# Patient Record
Sex: Male | Born: 2000
Health system: Southern US, Community
[De-identification: ages and names within clinical notes are randomized; demographics above are authoritative.]

## PROBLEM LIST (undated history)

## (undated) DIAGNOSIS — F419 Anxiety disorder, unspecified: Secondary | ICD-10-CM

## (undated) DIAGNOSIS — K219 Gastro-esophageal reflux disease without esophagitis: Secondary | ICD-10-CM

## (undated) DIAGNOSIS — F32A Depression, unspecified: Secondary | ICD-10-CM

## (undated) DIAGNOSIS — T7840XA Allergy, unspecified, initial encounter: Secondary | ICD-10-CM

## (undated) DIAGNOSIS — F909 Attention-deficit hyperactivity disorder, unspecified type: Secondary | ICD-10-CM

## (undated) HISTORY — DX: Allergy, unspecified, initial encounter: T78.40XA

## (undated) HISTORY — DX: Gastro-esophageal reflux disease without esophagitis: K21.9

## (undated) HISTORY — DX: Attention-deficit hyperactivity disorder, unspecified type: F90.9

## (undated) HISTORY — DX: Depression, unspecified: F32.A

## (undated) HISTORY — DX: Anxiety disorder, unspecified: F41.9

---
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
## 2019-02-02 DIAGNOSIS — R21 Rash and other nonspecific skin eruption: Secondary | ICD-10-CM | POA: Diagnosis not present

## 2019-02-02 DIAGNOSIS — W57XXXA Bitten or stung by nonvenomous insect and other nonvenomous arthropods, initial encounter: Secondary | ICD-10-CM | POA: Diagnosis not present

## 2019-06-01 DIAGNOSIS — F4010 Social phobia, unspecified: Secondary | ICD-10-CM | POA: Diagnosis not present

## 2019-06-01 DIAGNOSIS — F902 Attention-deficit hyperactivity disorder, combined type: Secondary | ICD-10-CM | POA: Diagnosis not present

## 2019-06-01 DIAGNOSIS — F419 Anxiety disorder, unspecified: Secondary | ICD-10-CM | POA: Diagnosis not present

## 2019-06-01 DIAGNOSIS — F411 Generalized anxiety disorder: Secondary | ICD-10-CM | POA: Diagnosis not present

## 2019-08-24 DIAGNOSIS — F411 Generalized anxiety disorder: Secondary | ICD-10-CM | POA: Diagnosis not present

## 2019-08-24 DIAGNOSIS — F902 Attention-deficit hyperactivity disorder, combined type: Secondary | ICD-10-CM | POA: Diagnosis not present

## 2019-08-24 DIAGNOSIS — F4010 Social phobia, unspecified: Secondary | ICD-10-CM | POA: Diagnosis not present

## 2019-08-24 DIAGNOSIS — F419 Anxiety disorder, unspecified: Secondary | ICD-10-CM | POA: Diagnosis not present

## 2019-09-29 ENCOUNTER — Other Ambulatory Visit: Payer: Self-pay

## 2019-09-29 ENCOUNTER — Encounter: Payer: Self-pay | Admitting: Family Medicine

## 2019-09-29 ENCOUNTER — Ambulatory Visit: Payer: Self-pay | Admitting: Family Medicine

## 2019-09-29 ENCOUNTER — Other Ambulatory Visit: Payer: Self-pay | Admitting: Family Medicine

## 2019-09-29 VITALS — BP 129/71 | HR 84 | Temp 97.8°F | Ht 71.75 in | Wt 263.6 lb

## 2019-09-29 DIAGNOSIS — F3341 Major depressive disorder, recurrent, in partial remission: Secondary | ICD-10-CM

## 2019-09-29 DIAGNOSIS — F411 Generalized anxiety disorder: Secondary | ICD-10-CM

## 2019-09-29 DIAGNOSIS — Z7689 Persons encountering health services in other specified circumstances: Secondary | ICD-10-CM | POA: Diagnosis not present

## 2019-09-29 DIAGNOSIS — F902 Attention-deficit hyperactivity disorder, combined type: Secondary | ICD-10-CM | POA: Diagnosis not present

## 2019-09-29 DIAGNOSIS — Z0000 Encounter for general adult medical examination without abnormal findings: Secondary | ICD-10-CM

## 2019-09-29 DIAGNOSIS — R7309 Other abnormal glucose: Secondary | ICD-10-CM

## 2019-09-29 DIAGNOSIS — E669 Obesity, unspecified: Secondary | ICD-10-CM

## 2019-09-29 DIAGNOSIS — E785 Hyperlipidemia, unspecified: Secondary | ICD-10-CM

## 2019-09-29 DIAGNOSIS — K219 Gastro-esophageal reflux disease without esophagitis: Secondary | ICD-10-CM | POA: Insufficient documentation

## 2019-09-29 DIAGNOSIS — Z87898 Personal history of other specified conditions: Secondary | ICD-10-CM

## 2019-09-29 DIAGNOSIS — Z79899 Other long term (current) drug therapy: Secondary | ICD-10-CM

## 2019-09-29 MED ORDER — OMEPRAZOLE 20 MG PO CPDR: 20.0000 mg | DELAYED_RELEASE_CAPSULE | Freq: Every day | ORAL | 2 refills | Status: DC

## 2019-09-29 NOTE — Patient Instructions (Addendum)
Thank you for coming to the office today.  Request records from specialists and pediatrician.  Keep on current meds. Keep asking Dr Daleen Squibb about future plans on medicines and adjustment.  Start generic Omeprazole 20mg  daily before breakfast 10-82min. For 4-6 weeks then taper off gradually if needed.  DIET RECOMMENDATIONS - Avoid spicy, greasy, fried foods, also things like caffeine, dark chocolate, peppermint can worsen - Avoid large meals and late night snacks, also do not go more than 4-5 hours without a snack or meal (not eating will worsen reflux symptoms due to stomach acid) - You may also elevate the head of your bed at night to sleep at very slight incline to help reduce symptoms  If the problem improves but keeps coming back, we can discuss higher dose or longer course at next visit.  If symptoms are worsening or persistent despite treatment or develop any different severe esophagus or abdominal pain, unable to swallow solids or liquids, nausea, vomiting especially blood in vomit, fever/chills, or unintentional weight loss / no appetite, please follow-up sooner in office or seek more immediate medical attention at hospital Emergency Department.  Regarding other medicines:  - STOP taking Ibuprofen, Advil, Motrin, Goody's / BC powder - DO NOT take without discussing with your doctor. These medicines can put you at high risk for future bleeding.  If need pain medicine, may take Tylenol Extra Strength (Acetaminophen) 500mg  tabs - take 1 to 2 tabs per dose (max 1000mg ) every 6-8 hours for pain (take regularly, don't skip a dose for next 7 days), max 24 hour daily dose is 6 tablets or 3000mg . In the future you can repeat the same everyday Tylenol course for 1-2 weeks at a time.    DUE for FASTING BLOOD WORK (no food or drink after midnight before the lab appointment, only water or coffee without cream/sugar on the morning of)  SCHEDULE "Lab Only" visit in the morning at the clinic for  lab draw in 4 WEEKS  - Make sure Lab Only appointment is at about 1 week before your next appointment, so that results will be available  For Lab Results, once available within 2-3 days of blood draw, you can can log in to MyChart online to view your results and a brief explanation. Also, we can discuss results at next follow-up visit.   Please schedule a Follow-up Appointment to: Return in about 4 weeks (around 10/27/2019) for Annual Physical.  If you have any other questions or concerns, please feel free to call the office or send a message through MyChart. You may also schedule an earlier appointment if necessary.  Additionally, you may be receiving a survey about your experience at our office within a few days to 1 week by e-mail or mail. We value your feedback.  , DO [facility], 

## 2019-09-29 NOTE — Progress Notes (Signed)
Subjective:    Patient ID: Kevin Moody, male    DOB: 03/08/2001, 19 y.o.   MRN: [id number]  Kevin Moody is a 19 y.o. male presenting on 09/29/2019 for Establish Care, ADHD, Depression, Anxiety, and Gastroesophageal Reflux  Here for establish care. He is accompanied by his mother, Kevin Moody.  HPI   History epistaxis Frequent problem, easily provoked. No significant prior issue. Dr Elenore RotaJuengel ENT has seen in past.  Major Depression / Anxiety / ADHD Reports initial diagnosis age 89 with ADHD pediatrician, was referred to [facility] at age 515-6, has been managed since. Family reports that there was no obvious anxiety or depression prior to managing ADHD. It was difficult to determine if meds for ADHD factored it. He has had some mood swings as entered adolescence. - Significant fam history of mental health problems both parents, mother has depression, anxiety, bipolar - He is currently followed by [facility]iatry at [facility] - Dr Daleen SquibbWall in [place] for past 5-6 years. - Concerta (generic Methylphenidate) 36mg  CR daily, taking 5-6 times a week, does not always take on weekend, may skip dose if not going to work. - Fluoxetine 10mg  x 3 = 30mg  daily - Buspar 30mg  daily in AM, can take PM if needed. For anxiety and agitation. - unsure if helping - Lorazepam 1mg  take q 8 hr PRN anxiety panic or severe anxiety agitation - takes 1-2 times a month if that. - Taking Trazodone 50mg  nightly PRN insomnia - maybe taking 2-3 times a month  GERD Reports symptoms bothering him for past 10+ years, mild symptoms usually worse with certain foods, he has done OTC remedy at times. He says acidic foods can worsen. Often has larger meal in evening, not eating as much during day. - He may have taken OTC PPI but unsure last dose. - Doesn't drink coffee. Drinks 3-4 sodas most days he does drink a lot of water. Nicotine from Vaping. - mother doesn't like long term PPI she had issues with  it.  History of Elevated Cholesterol Fam history of HTN / HLD.  Vaping / E-Cig user He was dipping tobacco smokeless for intermittent for 1 year Now E-Cig Vaping, Using for past 2 years now. Admits vaping multiple times a day, if he is working he can go most of day without it. Seems to mostly be a habit.  Graduated high school He is working at [employer] store in [place] He may go to community college future Living in [place] with mother No siblings. He has friends in countryside and hunting and fishing.  Health Maintenance:  UTD for vaccines. Will review through [facility]  Depression screen [facility] 2/9 09/29/2019  Decreased Interest 0  Down, Depressed, Hopeless 0  PHQ - 2 Score 0  Altered sleeping 0  Tired, decreased energy 1  Change in appetite 0  Feeling bad or failure about yourself  0  Trouble concentrating 1  Moving slowly or fidgety/restless 1  Suicidal thoughts 0  PHQ-9 Score 3  Difficult doing work/chores Not difficult at all   GAD 7 : Generalized Anxiety Score 09/29/2019  Nervous, Anxious, on Edge 1  Control/stop worrying 1  Worry too much - different things 1  Trouble relaxing 1  Restless 0  Easily annoyed or irritable 2  Afraid - awful might happen 0  Total GAD 7 Score 6  Anxiety Difficulty Somewhat difficult      Past Medical History:  Diagnosis Date  . ADHD   . Allergy   .  Anxiety   . Depression   . GERD (gastroesophageal reflux disease)    Past Surgical History:  Procedure Laterality Date  . TYMPANOSTOMY TUBE PLACEMENT Bilateral 2004   repeat 2007   Social History   Socioeconomic History  . Marital status: Single    Spouse name: Not on file  . Number of children: Not on file  . Years of education: [employer]  . Highest education level: High school graduate  Occupational History  . Occupation: Hardware  Tobacco Use  . Smoking status: Never Smoker  . Smokeless tobacco: Former Neurosurgeon  . Tobacco comment: previously 1/3 can dipping 2 years  Substance  and Sexual Activity  . Alcohol use: Never  . Drug use: Yes    Types: Marijuana  . Sexual activity: Not on file  Other Topics Concern  . Not on file  Social History Narrative  . Not on file   Social Determinants of Health   Financial Resource Strain:   . Difficulty of Paying Living Expenses:   Food Insecurity:   . Worried About Programme researcher, broadcasting/film/video in the Last Year:   . Barista in the Last Year:   Transportation Needs:   . Freight forwarder (Medical):   Marland Kitchen Lack of Transportation (Non-Medical):   Physical Activity:   . Days of Exercise per Week:   . Minutes of Exercise per Session:   Stress:   . Feeling of Stress :   Social Connections:   . Frequency of Communication with Friends and Family:   . Frequency of Social Gatherings with Friends and Family:   . Attends Religious Services:   . Active Member of Clubs or Organizations:   . Attends Banker Meetings:   Marland Kitchen Marital Status:   Intimate Partner Violence:   . Fear of Current or Ex-Partner:   . Emotionally Abused:   Marland Kitchen Physically Abused:   . Sexually Abused:    Family History  Problem Relation Age of Onset  . Depression Mother   . Anxiety disorder Mother   . Bipolar disorder Mother   . Depression Father   . Anxiety disorder Father   . Depression Maternal Grandmother   . Anxiety disorder Maternal Grandmother   . Alcohol abuse Maternal Grandfather   . Alcohol abuse Maternal Uncle    Current Outpatient Medications on File Prior to Visit  Medication Sig  . acetaminophen (TYLENOL) 500 MG tablet Take by mouth.  . busPIRone (BUSPAR) 30 MG tablet Take 30 mg by mouth 2 (two) times daily. Patient is taking once daily  . FLUoxetine (PROZAC) 10 MG capsule Patient takes 3x10 mg capsules total 30 mg once per day in am daily  . LORazepam (ATIVAN) 1 MG tablet Take 1 mg by mouth every 8 (eight) hours as needed.  . methylphenidate 36 MG PO CR tablet Take 36 mg by mouth every morning.  . ondansetron (ZOFRAN  ODT) 4 MG disintegrating tablet Take 1 tablet (4 mg total) by mouth every 8 (eight) hours as needed for nausea or vomiting.  . traZODone (DESYREL) 50 MG tablet Take 50 mg by mouth daily as needed.   No current facility-administered medications on file prior to visit.    Review of Systems Per HPI unless specifically indicated above      Objective:    BP 129/71   Pulse 84   Temp 97.8 F (36.6 C) (Temporal)   Ht 5' 11.75" (1.822 m)   Wt 263 lb 9.6 oz (119.6 kg)  SpO2 98%   BMI 36.00 kg/m   Wt Readings from Last 3 Encounters:  09/29/19 263 lb 9.6 oz (119.6 kg) (>99 %, Z= 2.61)*  04/16/16 198 lb (89.8 kg) (97 %, Z= 1.87)*   * Growth percentiles are based on CDC (Boys, 2-20 Years) data.    Physical Exam Vitals and nursing note reviewed.  Constitutional:      General: He is not in acute distress.    Appearance: He is well-developed. He is obese. He is not diaphoretic.     Comments: Well-appearing, comfortable, cooperative  HENT:     Head: Normocephalic and atraumatic.  Eyes:     General:        Right eye: No discharge.        Left eye: No discharge.     Conjunctiva/sclera: Conjunctivae normal.  Cardiovascular:     Rate and Rhythm: Normal rate.  Pulmonary:     Effort: Pulmonary effort is normal.  Skin:    General: Skin is warm and dry.     Findings: No erythema or rash.  Neurological:     Mental Status: He is alert and oriented to person, place, and time.  Psychiatric:        Behavior: Behavior normal.     Comments: Well groomed, good eye contact, normal speech and thoughts        Results for orders placed or performed during the hospital encounter of 04/16/16  CBC with Differential  Result Value Ref Range   WBC 27.9 (H) 3.8 - 10.6 K/uL   RBC 5.79 4.40 - 5.90 MIL/uL   Hemoglobin 16.5 13.0 - 18.0 g/dL   HCT 48.3 40.0 - 52.0 %   MCV 83.5 80.0 - 100.0 fL   MCH 28.4 26.0 - 34.0 pg   MCHC 34.1 32.0 - 36.0 g/dL   RDW 13.1 11.5 - 14.5 %   Platelets 310 150 - 440  K/uL   Neutrophils Relative % 90 %   Neutro Abs 25.2 (H) 1.4 - 6.5 K/uL   Lymphocytes Relative 2 %   Lymphs Abs 0.6 (L) 1.0 - 3.6 K/uL   Monocytes Relative 7 %   Monocytes Absolute 1.8 (H) 0.2 - 1.0 K/uL   Eosinophils Relative 1 %   Eosinophils Absolute 0.1 0 - 0.7 K/uL   Basophils Relative 0 %   Basophils Absolute 0.1 0 - 0.1 K/uL  Comprehensive metabolic panel  Result Value Ref Range   Sodium 138 135 - 145 mmol/L   Potassium 4.0 3.5 - 5.1 mmol/L   Chloride 103 101 - 111 mmol/L   CO2 23 22 - 32 mmol/L   Glucose, Bld 145 (H) 65 - 99 mg/dL   BUN 20 6 - 20 mg/dL   Creatinine, Ser 0.75 0.50 - 1.00 mg/dL   Calcium 9.9 8.9 - 10.3 mg/dL   Total Protein 8.2 (H) 6.5 - 8.1 g/dL   Albumin 4.8 3.5 - 5.0 g/dL   AST 38 15 - 41 U/L   ALT 41 17 - 63 U/L   Alkaline Phosphatase 138 52 - 171 U/L   Total Bilirubin 0.9 0.3 - 1.2 mg/dL   GFR calc non Af Amer NOT CALCULATED >60 mL/min   GFR calc Af Amer NOT CALCULATED >60 mL/min   Anion gap 12 5 - 15  Lipase, blood  Result Value Ref Range   Lipase 70 (H) 11 - 51 U/L  CBC  Result Value Ref Range   WBC 20.9 (H) 3.8 - 10.6 K/uL  RBC 4.96 4.40 - 5.90 MIL/uL   Hemoglobin 14.1 13.0 - 18.0 g/dL   HCT 16.3 84.5 - 36.4 %   MCV 83.5 80.0 - 100.0 fL   MCH 28.5 26.0 - 34.0 pg   MCHC 34.1 32.0 - 36.0 g/dL   RDW 68.0 32.1 - 22.4 %   Platelets 278 150 - 440 K/uL  Mononucleosis screen  Result Value Ref Range   Mono Screen POSITIVE (A) NEGATIVE  Urinalysis, Routine w reflex microscopic  Result Value Ref Range   Color, Urine YELLOW (A) YELLOW   APPearance CLEAR (A) CLEAR   Specific Gravity, Urine >1.046 (H) 1.005 - 1.030   pH 6.0 5.0 - 8.0   Glucose, UA NEGATIVE NEGATIVE mg/dL   Hgb urine dipstick SMALL (A) NEGATIVE   Bilirubin Urine NEGATIVE NEGATIVE   Ketones, ur NEGATIVE NEGATIVE mg/dL   Protein, ur NEGATIVE NEGATIVE mg/dL   Nitrite NEGATIVE NEGATIVE   Leukocytes, UA NEGATIVE NEGATIVE   RBC / HPF 6-30 0 - 5 RBC/hpf   WBC, UA NONE SEEN 0 - 5  WBC/hpf   Bacteria, UA NONE SEEN NONE SEEN   Squamous Epithelial / LPF 0-5 (A) NONE SEEN   Mucus PRESENT       Assessment & Plan:   Problem List Items Addressed This Visit    Major depressive disorder, recurrent, in partial remission (HCC)   Relevant Medications   busPIRone (BUSPAR) 30 MG tablet   traZODone (DESYREL) 50 MG tablet   FLUoxetine (PROZAC) 10 MG capsule   LORazepam (ATIVAN) 1 MG tablet   Gastroesophageal reflux disease - Primary   Relevant Medications   omeprazole (PRILOSEC) 20 MG capsule   GAD (generalized anxiety disorder)   Relevant Medications   busPIRone (BUSPAR) 30 MG tablet   traZODone (DESYREL) 50 MG tablet   FLUoxetine (PROZAC) 10 MG capsule   LORazepam (ATIVAN) 1 MG tablet   Attention deficit hyperactivity disorder (ADHD), combined type    Other Visit Diagnoses    Encounter to establish care with new doctor       History of epistaxis         #Depression / Anxiety / ADHD, see above diagnoses Chronic problems since childhood, onset age 46 for ADHD concerns expressed with uncertain triggers for his mental health problems and if related to medication and genetics as he has strong fam history of mental health problem. Some history was suggestive for possible bipolar with his mood swings and depression/anxiety, will request Psychiatry records to review. - Currently seems well controlled and doing well, graduated high school and currently working - He is interested to reduce medicines in future. He will continue to work with his Psychiatrist of past 5-6 years - Discussed that I would likely not manage his psychiatric medications at this time given multiple comorbid conditions and multiple higher risk medications. - Some meds are PRN very rarely used - reassurance  #GERD Chronic problem 10+ years, mild persistent issue Seems primary trigger is dietary foods, lifestyle large late meal, nicotine (Vaping) caffeine (soda) Trial on PPI rx Omeprazole 20mg  daily in AM  before breakfast 4-6 weeks then may taper off, goal to avoid long term PPI use Encourage lifestyle diet modifications, eventual goal to avoid nicotine/caffeine to help control, weight loss   Request outside records from previous Pediatrician and Psychiatry [facility] [address]   Meds ordered this encounter  Medications  . omeprazole (PRILOSEC) 20 MG capsule    Sig: Take 1 capsule (20 mg total) by mouth daily  before breakfast. For 4-6 weeks then taper down off gradually    Dispense:  30 capsule    Refill:  2     Follow up plan: Return in about 4 weeks (around 10/27/2019) for Annual Physical.   Future labs in 4 weeks 10/29/19  Saralyn Pilar, DO [facility] 09/29/2019, 3:32 PM

## 2019-10-29 ENCOUNTER — Other Ambulatory Visit: Payer: BC Managed Care – PPO

## 2019-10-29 ENCOUNTER — Other Ambulatory Visit: Payer: Self-pay

## 2019-10-29 DIAGNOSIS — R7309 Other abnormal glucose: Secondary | ICD-10-CM | POA: Diagnosis not present

## 2019-10-29 DIAGNOSIS — E785 Hyperlipidemia, unspecified: Secondary | ICD-10-CM | POA: Diagnosis not present

## 2019-10-29 DIAGNOSIS — E66811 Obesity, class 1: Secondary | ICD-10-CM

## 2019-10-29 DIAGNOSIS — F3341 Major depressive disorder, recurrent, in partial remission: Secondary | ICD-10-CM

## 2019-10-29 DIAGNOSIS — F411 Generalized anxiety disorder: Secondary | ICD-10-CM

## 2019-10-29 DIAGNOSIS — Z0000 Encounter for general adult medical examination without abnormal findings: Secondary | ICD-10-CM | POA: Diagnosis not present

## 2019-10-29 DIAGNOSIS — F902 Attention-deficit hyperactivity disorder, combined type: Secondary | ICD-10-CM

## 2019-10-29 DIAGNOSIS — Z79899 Other long term (current) drug therapy: Secondary | ICD-10-CM | POA: Diagnosis not present

## 2019-10-29 DIAGNOSIS — E669 Obesity, unspecified: Secondary | ICD-10-CM

## 2019-10-30 LAB — CBC WITH DIFFERENTIAL/PLATELET
Absolute Monocytes: 874 cells/uL (ref 200–950)
Basophils Absolute: 83 cells/uL (ref 0–200)
Basophils Relative: 0.8 %
Eosinophils Absolute: 218 cells/uL (ref 15–500)
Eosinophils Relative: 2.1 %
HCT: 46.8 % (ref 38.5–50.0)
Hemoglobin: 15.3 g/dL (ref 13.2–17.1)
Lymphs Abs: 3026 cells/uL (ref 850–3900)
MCH: 28.7 pg (ref 27.0–33.0)
MCHC: 32.7 g/dL (ref 32.0–36.0)
MCV: 87.6 fL (ref 80.0–100.0)
MPV: 11.6 fL (ref 7.5–12.5)
Monocytes Relative: 8.4 %
Neutro Abs: 6198 cells/uL (ref 1500–7800)
Neutrophils Relative %: 59.6 %
Platelets: 318 10*3/uL (ref 140–400)
RBC: 5.34 10*6/uL (ref 4.20–5.80)
RDW: 12.8 % (ref 11.0–15.0)
Total Lymphocyte: 29.1 %
WBC: 10.4 10*3/uL (ref 3.8–10.8)

## 2019-10-30 LAB — COMPLETE METABOLIC PANEL WITH GFR
AG Ratio: 1.6 (calc) (ref 1.0–2.5)
ALT: 26 U/L (ref 8–46)
AST: 20 U/L (ref 12–32)
Albumin: 4.6 g/dL (ref 3.6–5.1)
Alkaline phosphatase (APISO): 95 U/L (ref 46–169)
BUN: 18 mg/dL (ref 7–20)
CO2: 30 mmol/L (ref 20–32)
Calcium: 10.3 mg/dL (ref 8.9–10.4)
Chloride: 104 mmol/L (ref 98–110)
Creat: 0.8 mg/dL (ref 0.60–1.26)
GFR, Est African American: 150 mL/min/{1.73_m2} (ref >=60)
GFR, Est Non African American: 130 mL/min/{1.73_m2} (ref >=60)
Globulin: 2.8 g/dL (calc) (ref 2.1–3.5)
Glucose, Bld: 91 mg/dL (ref 65–99)
Potassium: 4.8 mmol/L (ref 3.8–5.1)
Sodium: 139 mmol/L (ref 135–146)
Total Bilirubin: 0.3 mg/dL (ref 0.2–1.1)
Total Protein: 7.4 g/dL (ref 6.3–8.2)

## 2019-10-30 LAB — TSH: TSH: 3.53 mIU/L (ref 0.50–4.30)

## 2019-10-30 LAB — LIPID PANEL
Cholesterol: 190 mg/dL — ABNORMAL HIGH (ref <170)
HDL: 38 mg/dL — ABNORMAL LOW (ref >45)
LDL Cholesterol (Calc): 111 mg/dL (calc) — ABNORMAL HIGH (ref <110)
Non-HDL Cholesterol (Calc): 152 mg/dL (calc) — ABNORMAL HIGH (ref <120)
Total CHOL/HDL Ratio: 5 (calc) — ABNORMAL HIGH (ref <5.0)
Triglycerides: 309 mg/dL — ABNORMAL HIGH (ref <90)

## 2019-10-30 LAB — HEMOGLOBIN A1C
Hgb A1c MFr Bld: 5.4 % of total Hgb (ref <5.7)
Mean Plasma Glucose: 108 (calc)
eAG (mmol/L): 6 (calc)

## 2019-11-02 ENCOUNTER — Encounter: Payer: Self-pay | Admitting: Family Medicine

## 2019-11-02 ENCOUNTER — Other Ambulatory Visit: Payer: Self-pay | Admitting: Family Medicine

## 2019-11-02 ENCOUNTER — Ambulatory Visit (INDEPENDENT_AMBULATORY_CARE_PROVIDER_SITE_OTHER): Payer: BC Managed Care – PPO | Admitting: Family Medicine

## 2019-11-02 ENCOUNTER — Other Ambulatory Visit: Payer: Self-pay

## 2019-11-02 VITALS — BP 123/56 | HR 74 | Temp 97.5°F | Resp 16 | Ht 71.0 in | Wt 260.0 lb

## 2019-11-02 DIAGNOSIS — E669 Obesity, unspecified: Secondary | ICD-10-CM | POA: Diagnosis not present

## 2019-11-02 DIAGNOSIS — E782 Mixed hyperlipidemia: Secondary | ICD-10-CM

## 2019-11-02 DIAGNOSIS — F3341 Major depressive disorder, recurrent, in partial remission: Secondary | ICD-10-CM | POA: Diagnosis not present

## 2019-11-02 DIAGNOSIS — Z0000 Encounter for general adult medical examination without abnormal findings: Secondary | ICD-10-CM

## 2019-11-02 DIAGNOSIS — K219 Gastro-esophageal reflux disease without esophagitis: Secondary | ICD-10-CM

## 2019-11-02 DIAGNOSIS — Z79899 Other long term (current) drug therapy: Secondary | ICD-10-CM

## 2019-11-02 NOTE — Assessment & Plan Note (Signed)
Improved GERD on lifestyle and PPI Keep improving diet, avoid trigger foods, eating patterns May taper down on PPI Omeprazole 20mg , use current med, phase down to every other day then every 3rd day, may come off, notify if need new rx for PRN use

## 2019-11-02 NOTE — Assessment & Plan Note (Signed)
Improved weight loss Encouraged continued diet lifestyle exercise Handout on low glycemic index diet, low carb/sugar

## 2019-11-02 NOTE — Patient Instructions (Addendum)
Thank you for coming to the office today.  Keep on the right track with the stomach acid. Keep improving diet as you are. May reduce Omeprazole medicine down to every other day, then every 3rd day and phase down on it. - If you run out and need more, call our office and request a refill - give them new instructions, basically would be taking it only as needed.  Lab results  . Chemistry - Normal results, including electrolytes, kidney and liver function. Normal fasting blood sugar   2. Hemoglobin A1c (Diabetes screening) - 5.4, normal not in range of Pre-Diabetes (>5.7 to 6.4)  - Normal range. Keep working on low carb low sugar.  3. TSH Thyroid Function Tests - Normal.  4. Cholesterol - Abnormal elevated cholesterol results. Mild low 38 HDL (good cholesterol), mild elevated Triglycerides 309. - RESTART OTC Fish Oil supplement - 1,000 mg per capsule - recommend at least 2,000 mg per day, can do 1 twice a day or 2 once day. In future, we may need to increase it a bit.   5. CBC Blood Counts - Normal, no anemia, other abnormality   DUE for FASTING BLOOD WORK (no food or drink after midnight before the lab appointment, only water or coffee without cream/sugar on the morning of)  SCHEDULE "Lab Only" visit in the morning at the clinic for lab draw in 1 YEAR  - Make sure Lab Only appointment is at about 1 week before your next appointment, so that results will be available  For Lab Results, once available within 2-3 days of blood draw, you can can log in to MyChart online to view your results and a brief explanation. Also, we can discuss results at next follow-up visit.    Please schedule a Follow-up Appointment to: Return in about 1 year (around 11/01/2020) for Annual Physical.  If you have any other questions or concerns, please feel free to call the office or send a message through MyChart. You may also schedule an earlier appointment if necessary.  Additionally, you may be receiving a  survey about your experience at our office within a few days to 1 week by e-mail or mail. We value your feedback.  Saralyn Pilar, DO [facility] [place]

## 2019-11-02 NOTE — Assessment & Plan Note (Signed)
Followed by Psychiatry [facility] [address] Daleen Squibb Continue current med regimen, reviewed last office visit notes scanned into chart

## 2019-11-02 NOTE — Progress Notes (Signed)
Subjective:    Patient ID: Kevin Moody, male    DOB: 2000-08-11, 19 y.o.   MRN: [id number]  Kevin Moody is a 19 y.o. male presenting on 11/02/2019 for Annual Exam   HPI   Here for Annual Physical and Lab Review.  Major Depression / Anxiety / ADHD Reports initial diagnosis age 29 with ADHD pediatrician, was referred to [facility] at age 49-6, has been managed since. Family reports that there was no obvious anxiety or depression prior to managing ADHD. It was difficult to determine if meds for ADHD factored it. He has had some mood swings as entered adolescence. - Significant fam history of mental health problems both parents, mother has depression, anxiety, bipolar - He is currently followed by Psychiatry at [facility] - Dr Daleen Squibb in [place] for past 5-6 years. Records have been sent to our office, and are scanned into the chart. - Concerta (generic Methylphenidate) 36mg  CR daily, taking 5-6 times a week, does not always take on weekend, may skip dose if not going to work. - Fluoxetine 10mg  x 3 = 30mg  daily - Buspar 30mg  daily in AM, can take PM if needed. For anxiety and agitation. - unsure if helping - Lorazepam 1mg  take q 8 hr PRN anxiety panic or severe anxiety agitation - takes 1-2 times a month if that. - Taking Trazodone 50mg  nightly PRN insomnia - maybe taking 2-3 times a month  GERD Last visit about 1 month ago, this issue was addressed and he was started on diet regimen changes to avoid stomach acid and started Omeprazole 20mg  daily new rx. - Today he reports much improved on diet, and now also improved on medicine, he takes it as needed, but plans to taper down.  Hyperlipidemia / Elevated Trigerlycides / Obesity BMI >36 - Admits family history of HTN, Hyperlipidemia - father's side of family - Lab shows A1c 5.4 and TG >300. No prior results available. - Weight down since last visit - He is working on improving diet and lifestyle - Now he is  limiting his soda, drinking mostly water   Health Maintenance:  Not updated on COVID19  Depression screen [facility] 2/9 11/02/2019 09/29/2019  Decreased Interest 0 0  Down, Depressed, Hopeless 0 0  PHQ - 2 Score 0 0  Altered sleeping 0 0  Tired, decreased energy 0 1  Change in appetite 0 0  Feeling bad or failure about yourself  0 0  Trouble concentrating 0 1  Moving slowly or fidgety/restless 0 1  Suicidal thoughts 0 0  PHQ-9 Score 0 3  Difficult doing work/chores Not difficult at all Not difficult at all   GAD 7 : Generalized Anxiety Score 11/02/2019 09/29/2019  Nervous, Anxious, on Edge 1 1  Control/stop worrying 1 1  Worry too much - different things 0 1  Trouble relaxing 0 1  Restless 0 0  Easily annoyed or irritable 3 2  Afraid - awful might happen 0 0  Total GAD 7 Score 5 6  Anxiety Difficulty Not difficult at all Somewhat difficult     Past Medical History:  Diagnosis Date  . ADHD   . Allergy   . Anxiety   . Depression   . GERD (gastroesophageal reflux disease)    Past Surgical History:  Procedure Laterality Date  . TYMPANOSTOMY TUBE PLACEMENT Bilateral 2004   repeat 2007   Social History   Socioeconomic History  . Marital status: Single    Spouse name: Not on  file  . Number of children: Not on file  . Years of education: [employer]  . Highest education level: High school graduate  Occupational History  . Occupation: Hardware  Tobacco Use  . Smoking status: Never Smoker  . Smokeless tobacco: Former Neurosurgeon  . Tobacco comment: previously 1/3 can dipping 2 years  Vaping Use  . Vaping Use: Every day  Substance and Sexual Activity  . Alcohol use: Never  . Drug use: Yes    Types: Marijuana  . Sexual activity: Not on file  Other Topics Concern  . Not on file  Social History Narrative  . Not on file   Social Determinants of Health   Financial Resource Strain:   . Difficulty of Paying Living Expenses:   Food Insecurity:   . Worried About Brewing technologist in the Last Year:   . Barista in the Last Year:   Transportation Needs:   . Freight forwarder (Medical):   Marland Kitchen Lack of Transportation (Non-Medical):   Physical Activity:   . Days of Exercise per Week:   . Minutes of Exercise per Session:   Stress:   . Feeling of Stress :   Social Connections:   . Frequency of Communication with Friends and Family:   . Frequency of Social Gatherings with Friends and Family:   . Attends Religious Services:   . Active Member of Clubs or Organizations:   . Attends Banker Meetings:   Marland Kitchen Marital Status:   Intimate Partner Violence:   . Fear of Current or Ex-Partner:   . Emotionally Abused:   Marland Kitchen Physically Abused:   . Sexually Abused:    Family History  Problem Relation Age of Onset  . Depression Mother   . Anxiety disorder Mother   . Bipolar disorder Mother   . Depression Father   . Anxiety disorder Father   . Depression Maternal Grandmother   . Anxiety disorder Maternal Grandmother   . Alcohol abuse Maternal Grandfather   . Alcohol abuse Maternal Uncle    Current Outpatient Medications on File Prior to Visit  Medication Sig  . acetaminophen (TYLENOL) 500 MG tablet Take by mouth.  . busPIRone (BUSPAR) 30 MG tablet Take 30 mg by mouth 2 (two) times daily. Patient is taking once daily  . FLUoxetine (PROZAC) 10 MG capsule Patient takes 3x10 mg capsules total 30 mg once per day in am daily  . LORazepam (ATIVAN) 1 MG tablet Take 1 mg by mouth every 8 (eight) hours as needed.  . methylphenidate 36 MG PO CR tablet Take 36 mg by mouth every morning.  . Omega-3 1000 MG CAPS Take 1,000 mg by mouth 2 (two) times daily with a meal.  . omeprazole (PRILOSEC) 20 MG capsule Take 1 capsule (20 mg total) by mouth daily before breakfast. For 4-6 weeks then taper down off gradually  . ondansetron (ZOFRAN ODT) 4 MG disintegrating tablet Take 1 tablet (4 mg total) by mouth every 8 (eight) hours as needed for nausea or vomiting.  .  traZODone (DESYREL) 50 MG tablet Take 50 mg by mouth daily as needed.   No current facility-administered medications on file prior to visit.    Review of Systems  Constitutional: Negative for activity change, appetite change, chills, diaphoresis, fatigue and fever.  HENT: Negative for congestion and hearing loss.   Eyes: Negative for visual disturbance.  Respiratory: Negative for apnea, cough, chest tightness, shortness of breath and wheezing.   Cardiovascular: Negative  for chest pain, palpitations and leg swelling.  Gastrointestinal: Negative for abdominal pain, anal bleeding, blood in stool, constipation, diarrhea, nausea and vomiting.  Endocrine: Negative for cold intolerance.  Genitourinary: Negative for difficulty urinating, dysuria, frequency and hematuria.  Musculoskeletal: Negative for arthralgias and neck pain.  Skin: Negative for rash.  Allergic/Immunologic: Negative for environmental allergies.  Neurological: Negative for dizziness, weakness, light-headedness, numbness and headaches.  Hematological: Negative for adenopathy.  Psychiatric/Behavioral: Negative for behavioral problems, dysphoric mood and sleep disturbance.   Per HPI unless specifically indicated above      Objective:    BP (!) 123/56   Pulse 74   Temp (!) 97.5 F (36.4 C) (Temporal)   Resp 16   Ht  (1.803 m)   Wt 260 lb (117.9 kg)   SpO2 98%   BMI 36.26 kg/m   Wt Readings from Last 3 Encounters:  11/02/19 260 lb (117.9 kg) (>99 %, Z= 2.55)*  09/29/19 263 lb 9.6 oz (119.6 kg) (>99 %, Z= 2.61)*  04/16/16 198 lb (89.8 kg) (97 %, Z= 1.87)*   * Growth percentiles are based on CDC (Boys, 2-20 Years) data.    Physical Exam Vitals and nursing note reviewed.  Constitutional:      General: He is not in acute distress.    Appearance: He is well-developed. He is obese. He is not diaphoretic.     Comments: Well-appearing, comfortable, cooperative  HENT:     Head: Normocephalic and atraumatic.    Eyes:     General:        Right eye: No discharge.        Left eye: No discharge.     Conjunctiva/sclera: Conjunctivae normal.     Pupils: Pupils are equal, round, and reactive to light.  Neck:     Thyroid: No thyromegaly.     Vascular: No carotid bruit.  Cardiovascular:     Rate and Rhythm: Normal rate and regular rhythm.     Heart sounds: Normal heart sounds. No murmur heard.   Pulmonary:     Effort: Pulmonary effort is normal. No respiratory distress.     Breath sounds: Normal breath sounds. No wheezing or rales.  Abdominal:     General: Bowel sounds are normal. There is no distension.     Palpations: Abdomen is soft. There is no mass.     Tenderness: There is no abdominal tenderness.  Musculoskeletal:        General: No tenderness. Normal range of motion.     Cervical back: Normal range of motion and neck supple.     Comments: Upper / Lower Extremities: - Normal muscle tone, strength bilateral upper extremities 5/5, lower extremities 5/5  Lymphadenopathy:     Cervical: No cervical adenopathy.  Skin:    General: Skin is warm and dry.     Findings: No erythema or rash.  Neurological:     Mental Status: He is alert and oriented to person, place, and time.     Comments: Distal sensation intact to light touch all extremities  Psychiatric:        Behavior: Behavior normal.     Comments: Well groomed, good eye contact, normal speech and thoughts       Results for orders placed or performed in visit on 10/29/19  TSH  Result Value Ref Range   TSH 3.53 0.50 - 4.30 mIU/L  Lipid panel  Result Value Ref Range   Cholesterol 190 (H) <170 mg/dL   HDL 38 (L) >  45 mg/dL   Triglycerides 161309 (H) <90 mg/dL   LDL Cholesterol (Calc) 111 (H) <110 mg/dL (calc)   Total CHOL/HDL Ratio 5.0 (H) <5.0 (calc)   Non-HDL Cholesterol (Calc) 152 (H) <120 mg/dL (calc)  COMPLETE METABOLIC PANEL WITH GFR  Result Value Ref Range   Glucose, Bld 91 65 - 99 mg/dL   BUN 18 7 - 20 mg/dL   Creat 0.960.80  0.450.60 - 4.091.26 mg/dL   GFR, Est Non African American 130 > OR = 60 mL/min/1.1073m2   GFR, Est African American 150 > OR = 60 mL/min/1.5073m2   BUN/Creatinine Ratio NOT APPLICABLE 6 - 22 (calc)   Sodium 139 135 - 146 mmol/L   Potassium 4.8 3.8 - 5.1 mmol/L   Chloride 104 98 - 110 mmol/L   CO2 30 20 - 32 mmol/L   Calcium 10.3 8.9 - 10.4 mg/dL   Total Protein 7.4 6.3 - 8.2 g/dL   Albumin 4.6 3.6 - 5.1 g/dL   Globulin 2.8 2.1 - 3.5 g/dL (calc)   AG Ratio 1.6 1.0 - 2.5 (calc)   Total Bilirubin 0.3 0.2 - 1.1 mg/dL   Alkaline phosphatase (APISO) 95 46 - 169 U/L   AST 20 12 - 32 U/L   ALT 26 8 - 46 U/L  CBC with Differential/Platelet  Result Value Ref Range   WBC 10.4 3.8 - 10.8 Thousand/uL   RBC 5.34 4.20 - 5.80 Million/uL   Hemoglobin 15.3 13.2 - 17.1 g/dL   HCT 81.146.8 38 - 50 %   MCV 87.6 80.0 - 100.0 fL   MCH 28.7 27.0 - 33.0 pg   MCHC 32.7 32.0 - 36.0 g/dL   RDW 91.412.8 78.211.0 - 95.615.0 %   Platelets 318 140 - 400 Thousand/uL   MPV 11.6 7.5 - 12.5 fL   Neutro Abs 6,198 1,500 - 7,800 cells/uL   Lymphs Abs 3,026 850 - 3,900 cells/uL   Absolute Monocytes 874 200 - 950 cells/uL   Eosinophils Absolute 218 15 - 500 cells/uL   Basophils Absolute 83 0 - 200 cells/uL   Neutrophils Relative % 59.6 %   Total Lymphocyte 29.1 %   Monocytes Relative 8.4 %   Eosinophils Relative 2.1 %   Basophils Relative 0.8 %  Hemoglobin A1c  Result Value Ref Range   Hgb A1c MFr Bld 5.4 <5.7 % of total Hgb   Mean Plasma Glucose 108 (calc)   eAG (mmol/L) 6.0 (calc)      Assessment & Plan:   Problem List Items Addressed This Visit    Obesity (BMI 35.0-39.9 without comorbidity)    Improved weight loss Encouraged continued diet lifestyle exercise Handout on low glycemic index diet, low carb/sugar      Mixed hyperlipidemia    Uncontrolled triglyceride cholesterol, improving lifestyle No comparison lipids, known fam history of HLD Last lipid panel 10/2019  Plan: 1. START FIsh Oil Omega 3 supplement 1,000 BID  with meal, may inc to 2k BID if needed 2. Encourage improved lifestyle - low carb/cholesterol, reduce portion size, continue improving regular exercise  Check yearly lipids      Major depressive disorder, recurrent, in partial remission (HCC)    Followed by Psychiatry [facility] Dr Daleen SquibbWall Continue current med regimen, reviewed last office visit notes scanned into chart      Gastroesophageal reflux disease    Improved GERD on lifestyle and PPI Keep improving diet, avoid trigger foods, eating patterns May taper down on PPI Omeprazole 20mg , use current med, phase down to  every other day then every 3rd day, may come off, notify if need new rx for PRN use       Other Visit Diagnoses    Annual physical exam    -  Primary      Updated Health Maintenance information Reviewed recent lab results with patient Encouraged improvement to lifestyle with diet and exercise - Goal of weight loss   No orders of the defined types were placed in this encounter.     Follow up plan: Return in about 1 year (around 11/01/2020) for Annual Physical.  Future labs ordered for 10/2020  Saralyn Pilar, DO [facility] 11/02/2019, 11:35 AM

## 2019-11-02 NOTE — Assessment & Plan Note (Signed)
Uncontrolled triglyceride cholesterol, improving lifestyle No comparison lipids, known fam history of HLD Last lipid panel 10/2019  Plan: 1. START FIsh Oil Omega 3 supplement 1,000 BID with meal, may inc to 2k BID if needed 2. Encourage improved lifestyle - low carb/cholesterol, reduce portion size, continue improving regular exercise  Check yearly lipids

## 2020-03-22 DIAGNOSIS — Z20822 Contact with and (suspected) exposure to covid-19: Secondary | ICD-10-CM | POA: Diagnosis not present

## 2020-04-28 ENCOUNTER — Other Ambulatory Visit: Payer: 59

## 2020-04-28 DIAGNOSIS — Z20822 Contact with and (suspected) exposure to covid-19: Secondary | ICD-10-CM

## 2020-04-30 LAB — NOVEL CORONAVIRUS, NAA: SARS-CoV-2, NAA: NOT DETECTED

## 2020-04-30 LAB — SARS-COV-2, NAA 2 DAY TAT

## 2021-04-11 ENCOUNTER — Ambulatory Visit (INDEPENDENT_AMBULATORY_CARE_PROVIDER_SITE_OTHER): Payer: 59 | Admitting: Family Medicine

## 2021-04-11 ENCOUNTER — Encounter: Payer: Self-pay | Admitting: Family Medicine

## 2021-04-11 ENCOUNTER — Other Ambulatory Visit: Payer: Self-pay

## 2021-04-11 VITALS — BP 121/67 | HR 75 | Ht 72.0 in | Wt 261.6 lb

## 2021-04-11 DIAGNOSIS — F3341 Major depressive disorder, recurrent, in partial remission: Secondary | ICD-10-CM

## 2021-04-11 DIAGNOSIS — Z0000 Encounter for general adult medical examination without abnormal findings: Secondary | ICD-10-CM

## 2021-04-11 DIAGNOSIS — E782 Mixed hyperlipidemia: Secondary | ICD-10-CM | POA: Diagnosis not present

## 2021-04-11 DIAGNOSIS — E669 Obesity, unspecified: Secondary | ICD-10-CM | POA: Diagnosis not present

## 2021-04-11 DIAGNOSIS — F411 Generalized anxiety disorder: Secondary | ICD-10-CM

## 2021-04-11 DIAGNOSIS — F902 Attention-deficit hyperactivity disorder, combined type: Secondary | ICD-10-CM

## 2021-04-11 LAB — COMPLETE METABOLIC PANEL WITHOUT GFR
AG Ratio: 1.7 (calc) (ref 1.0–2.5)
ALT: 28 U/L (ref 9–46)
AST: 22 U/L (ref 10–40)
Albumin: 4.7 g/dL (ref 3.6–5.1)
Alkaline phosphatase (APISO): 83 U/L (ref 36–130)
BUN: 12 mg/dL (ref 7–25)
CO2: 26 mmol/L (ref 20–32)
Calcium: 10.2 mg/dL (ref 8.6–10.3)
Chloride: 104 mmol/L (ref 98–110)
Creat: 0.71 mg/dL (ref 0.60–1.24)
Globulin: 2.8 g/dL (ref 1.9–3.7)
Glucose, Bld: 96 mg/dL (ref 65–99)
Potassium: 4.5 mmol/L (ref 3.5–5.3)
Sodium: 138 mmol/L (ref 135–146)
Total Bilirubin: 0.7 mg/dL (ref 0.2–1.2)
Total Protein: 7.5 g/dL (ref 6.1–8.1)
eGFR: 134 mL/min/{1.73_m2}

## 2021-04-11 LAB — CBC WITH DIFFERENTIAL/PLATELET
Absolute Monocytes: 720 cells/uL (ref 200–950)
Basophils Absolute: 63 cells/uL (ref 0–200)
Basophils Relative: 0.7 %
Eosinophils Absolute: 261 cells/uL (ref 15–500)
Eosinophils Relative: 2.9 %
HCT: 47 % (ref 38.5–50.0)
Hemoglobin: 16 g/dL (ref 13.2–17.1)
Lymphs Abs: 2754 cells/uL (ref 850–3900)
MCH: 29.1 pg (ref 27.0–33.0)
MCHC: 34 g/dL (ref 32.0–36.0)
MCV: 85.6 fL (ref 80.0–100.0)
MPV: 11.9 fL (ref 7.5–12.5)
Monocytes Relative: 8 %
Neutro Abs: 5202 cells/uL (ref 1500–7800)
Neutrophils Relative %: 57.8 %
Platelets: 344 10*3/uL (ref 140–400)
RBC: 5.49 10*6/uL (ref 4.20–5.80)
RDW: 12.4 % (ref 11.0–15.0)
Total Lymphocyte: 30.6 %
WBC: 9 10*3/uL (ref 3.8–10.8)

## 2021-04-11 LAB — LIPID PANEL
Cholesterol: 178 mg/dL (ref <200)
HDL: 46 mg/dL (ref >=40)
LDL Cholesterol (Calc): 117 mg/dL (calc) — ABNORMAL HIGH
Non-HDL Cholesterol (Calc): 132 mg/dL (calc) — ABNORMAL HIGH (ref <130)
Total CHOL/HDL Ratio: 3.9 (calc) (ref <5.0)
Triglycerides: 63 mg/dL (ref <150)

## 2021-04-11 NOTE — Patient Instructions (Addendum)
Thank you for coming to the office today.  DUE for FASTING BLOOD WORK TODAY  For Lab Results, once available within 2-3 days of blood draw, you can can log in to MyChart online to view your results and a brief explanation. Let me know if questions.  Keep up the great work and if you need any other assistance with mental health can follow up with me or return to your previous Psychiatry doctor.   Please schedule a Follow-up Appointment to: Return in about 1 year (around 04/11/2022) for 1 year Annual Physical AM fasting lab AFTER.  If you have any other questions or concerns, please feel free to call the office or send a message through MyChart. You may also schedule an earlier appointment if necessary.  Additionally, you may be receiving a survey about your experience at our office within a few days to 1 week by e-mail or mail. We value your feedback.  Saralyn Pilar, DO [facility], [place]

## 2021-04-11 NOTE — Progress Notes (Signed)
Subjective:    Patient ID: Kevin Moody, male    DOB: 2000-05-05, 20 y.o.   MRN: [id number]  Kevin Moody is a 20 y.o. male presenting on 04/11/2021 for Annual Exam   HPI  Here for Annual Physical and Lab Review.   Major Depression / Anxiety / ADHD Prior history diagnosis age 32 with ADHD, and mood and anxiety. Previously managed by Psychiatry at [facility] - Dr Verl Blalock in [place] He was on medication including FLuoxetine, Buspar, Trazodone, Lorazepam, Methylphenidate, but has come off of all meds and doing well, no longer followed by Psychiatry since 2021.  GERD Takes GERD acid reflux med Omeprazole as needed. Doing well. - Today he reports much improved on diet   Hyperlipidemia / Elevated Trigerlycides / Obesity BMI >35 He has had prior history of elevated cholesterol and TG >300 He has improved his diet overall Due for fasting labs today.  - Admits family history of HTN, Hyperlipidemia - father's side of family - Lab shows A1c 5.4 and TG >300. No prior results available. - Weight down since last visit - He is working on improving diet and lifestyle - Now he is limiting his soda, drinking mostly water     Health Maintenance:   Not updated on COVID19   Depression screen [facility] 2/9 04/11/2021 11/02/2019 09/29/2019  Decreased Interest 0 0 0  Down, Depressed, Hopeless 0 0 0  PHQ - 2 Score 0 0 0  Altered sleeping 0 0 0  Tired, decreased energy 0 0 1  Change in appetite 0 0 0  Feeling bad or failure about yourself  0 0 0  Trouble concentrating 0 0 1  Moving slowly or fidgety/restless 0 0 1  Suicidal thoughts 0 0 0  PHQ-9 Score 0 0 3  Difficult doing work/chores Not difficult at all Not difficult at all Not difficult at all   GAD 7 : Generalized Anxiety Score 04/11/2021 11/02/2019 09/29/2019  Nervous, Anxious, on Edge 2 1 1   Control/stop worrying 1 1 1   Worry too much - different things 1 0 1  Trouble relaxing 1 0 1  Restless 0 0 0  Easily annoyed or  irritable 2 3 2   Afraid - awful might happen 0 0 0  Total GAD 7 Score 7 5 6   Anxiety Difficulty Not difficult at all Not difficult at all Somewhat difficult      Past Medical History:  Diagnosis Date   ADHD    Allergy    Anxiety    Depression    GERD (gastroesophageal reflux disease)    Past Surgical History:  Procedure Laterality Date   TYMPANOSTOMY TUBE PLACEMENT Bilateral 2004   repeat 2007   Social History   Socioeconomic History   Marital status: Single    Spouse name: Not on file   Number of children: Not on file   Years of education: High School   Highest education level: High school graduate  Occupational History   Occupation: Hardware  Tobacco Use   Smoking status: Never   Smokeless tobacco: Former    Quit date: 09/22/2018   Tobacco comments:    previously 1/3 can dipping 2 years  Vaping Use   Vaping Use: Every day  Substance and Sexual Activity   Alcohol use: Never   Drug use: Yes    Types: Marijuana   Sexual activity: Not on file  Other Topics Concern   Not on file  Social History Narrative   Not on file  Social Determinants of Health   Financial Resource Strain: Not on file  Food Insecurity: Not on file  Transportation Needs: Not on file  Physical Activity: Not on file  Stress: Not on file  Social Connections: Not on file  Intimate Partner Violence: Not on file   Family History  Problem Relation Age of Onset   Depression Mother    Anxiety disorder Mother    Bipolar disorder Mother    Depression Father    Anxiety disorder Father    Depression Maternal Grandmother    Anxiety disorder Maternal Grandmother    Alcohol abuse Maternal Grandfather    Alcohol abuse Maternal Uncle    Current Outpatient Medications on File Prior to Visit  Medication Sig   acetaminophen (TYLENOL) 500 MG tablet Take by mouth.   Omega-3 1000 MG CAPS Take 1,000 mg by mouth 2 (two) times daily with a meal.   omeprazole (PRILOSEC) 20 MG capsule Take 1 capsule (20  mg total) by mouth daily before breakfast. For 4-6 weeks then taper down off gradually   No current facility-administered medications on file prior to visit.    Review of Systems  Constitutional:  Negative for activity change, appetite change, chills, diaphoresis, fatigue and fever.  HENT:  Negative for congestion and hearing loss.   Eyes:  Negative for visual disturbance.  Respiratory:  Negative for cough, chest tightness, shortness of breath and wheezing.   Cardiovascular:  Negative for chest pain, palpitations and leg swelling.  Gastrointestinal:  Negative for abdominal pain, constipation, diarrhea, nausea and vomiting.  Genitourinary:  Negative for dysuria, frequency and hematuria.  Musculoskeletal:  Negative for arthralgias and neck pain.  Skin:  Negative for rash.  Neurological:  Negative for dizziness, weakness, light-headedness, numbness and headaches.  Hematological:  Negative for adenopathy.  Psychiatric/Behavioral:  Negative for behavioral problems, dysphoric mood and sleep disturbance.   Per HPI unless specifically indicated above     Objective:    BP 121/67    Pulse 75    Ht 6' (1.829 m)    Wt 261 lb 9.6 oz (118.7 kg)    SpO2 98%    BMI 35.48 kg/m   Wt Readings from Last 3 Encounters:  04/11/21 261 lb 9.6 oz (118.7 kg)  11/02/19 260 lb (117.9 kg) (>99 %, Z= 2.55)*  09/29/19 263 lb 9.6 oz (119.6 kg) (>99 %, Z= 2.61)*   * Growth percentiles are based on CDC (Boys, 2-20 Years) data.    Physical Exam Vitals and nursing note reviewed.  Constitutional:      General: He is not in acute distress.    Appearance: He is well-developed. He is not diaphoretic.     Comments: Well-appearing, comfortable, cooperative  HENT:     Head: Normocephalic and atraumatic.  Eyes:     General:        Right eye: No discharge.        Left eye: No discharge.     Conjunctiva/sclera: Conjunctivae normal.     Pupils: Pupils are equal, round, and reactive to light.  Neck:     Thyroid: No  thyromegaly.  Cardiovascular:     Rate and Rhythm: Normal rate and regular rhythm.     Pulses: Normal pulses.     Heart sounds: Normal heart sounds. No murmur heard. Pulmonary:     Effort: Pulmonary effort is normal. No respiratory distress.     Breath sounds: Normal breath sounds. No wheezing or rales.  Abdominal:     General: Bowel  sounds are normal. There is no distension.     Palpations: Abdomen is soft. There is no mass.     Tenderness: There is no abdominal tenderness.  Musculoskeletal:        General: No tenderness. Normal range of motion.     Cervical back: Normal range of motion and neck supple.     Comments: Upper / Lower Extremities: - Normal muscle tone, strength bilateral upper extremities 5/5, lower extremities 5/5  Lymphadenopathy:     Cervical: No cervical adenopathy.  Skin:    General: Skin is warm and dry.     Findings: No erythema or rash.  Neurological:     Mental Status: He is alert and oriented to person, place, and time.     Comments: Distal sensation intact to light touch all extremities  Psychiatric:        Mood and Affect: Mood normal.        Behavior: Behavior normal.        Thought Content: Thought content normal.     Comments: Well groomed, good eye contact, normal speech and thoughts     Results for orders placed or performed in visit on 04/28/20  Novel Coronavirus, NAA ([facility])   Specimen: Nasopharyngeal(NP) swabs in vial transport medium   Nasopharynge  Result Value Ref Range   SARS-CoV-2, NAA Not Detected Not Detected  SARS-COV-2, NAA 2 DAY TAT   Nasopharynge  Result Value Ref Range   SARS-CoV-2, NAA 2 DAY TAT Performed       Assessment & Plan:   Problem List Items Addressed This Visit     Obesity (BMI 35.0-39.9 without comorbidity)   Relevant Orders   COMPLETE METABOLIC PANEL WITH GFR   Lipid panel   CBC with Differential/Platelet   Mixed hyperlipidemia   Relevant Orders   COMPLETE METABOLIC PANEL WITH GFR   Lipid panel    Major depressive disorder, recurrent, in partial remission (HCC)   GAD (generalized anxiety disorder)   Attention deficit hyperactivity disorder (ADHD), combined type   Other Visit Diagnoses     Annual physical exam    -  Primary   Relevant Orders   COMPLETE METABOLIC PANEL WITH GFR   Lipid panel   CBC with Differential/Platelet       Updated Health Maintenance information Fasting lab today, pending results. Encouraged improvement to lifestyle with diet and exercise Goal of weight loss  Off medication for mood, ADD, anxiety. Doing well May return to Psych in future if indicated.  No orders of the defined types were placed in this encounter.    Follow up plan: Return in about 1 year (around 04/11/2022) for 1 year Annual Physical AM fasting lab AFTER.  Nobie Putnam, [facility] 04/11/2021, 9:54 AM

## 2021-07-01 ENCOUNTER — Other Ambulatory Visit: Payer: Self-pay

## 2021-07-01 ENCOUNTER — Ambulatory Visit
Admission: EM | Admit: 2021-07-01 | Discharge: 2021-07-01 | Disposition: A | Discharge status: Home / Self Care | Payer: 59 | Attending: Emergency Medicine | Admitting: Emergency Medicine

## 2021-07-01 ENCOUNTER — Encounter: Payer: Self-pay | Admitting: Emergency Medicine

## 2021-07-01 DIAGNOSIS — J069 Acute upper respiratory infection, unspecified: Secondary | ICD-10-CM | POA: Insufficient documentation

## 2021-07-01 LAB — GROUP A STREP BY PCR: Group A Strep by PCR: NOT DETECTED

## 2021-07-01 MED ORDER — IPRATROPIUM BROMIDE 0.06 % NA SOLN: 2.0000 | Freq: Four times a day (QID) | NASAL | 12 refills | Status: DC

## 2021-07-01 NOTE — ED Provider Notes (Signed)
[facility]    CSN: [id number] Arrival date & time: 07/01/21  1211      History   Chief Complaint Chief Complaint  Patient presents with   Sore Throat    HPI Kevin Moody is a 21 y.o. male.   HPI  21 year old male here for evaluation of sore throat.  Patient ports he woke up this morning and was experiencing a sore throat.  He is also had runny nose and nasal congestion but he typically has at this time a year and has not noticed any increase or changes.  He does have an intermittent cough that is occasionally productive but is mostly dry.  He denies any fever or ear pain.  Patient does have a history of allergies but does not take allergy medication regularly as he states he does not like to take medicine.  Past Medical History:  Diagnosis Date   ADHD    Allergy    Anxiety    Depression    GERD (gastroesophageal reflux disease)     Patient Active Problem List   Diagnosis Date Noted   Obesity (BMI 35.0-39.9 without comorbidity) 11/02/2019   Mixed hyperlipidemia 11/02/2019   Attention deficit hyperactivity disorder (ADHD), combined type 09/29/2019   Gastroesophageal reflux disease 09/29/2019   Major depressive disorder, recurrent, in partial remission ([place]) 09/29/2019   GAD (generalized anxiety disorder) 09/29/2019    Past Surgical History:  Procedure Laterality Date   TYMPANOSTOMY TUBE PLACEMENT Bilateral 2004   repeat 2007       Home Medications    Prior to Admission medications   Medication Sig Start Date End Date Taking? Authorizing Provider  ipratropium (ATROVENT) 0.06 % nasal spray Place 2 sprays into both nostrils 4 (four) times daily. 07/01/21  Yes Margarette Canada, NP  acetaminophen (TYLENOL) 500 MG tablet Take by mouth.    [provider]  Omega-3 1000 MG CAPS Take 1,000 mg by mouth 2 (two) times daily with a meal.    [provider]  omeprazole (PRILOSEC) 20 MG capsule Take 1 capsule (20 mg total) by mouth daily before  breakfast. For 4-6 weeks then taper down off gradually 09/29/19   Olin Hauser, DO    Family History Family History  Problem Relation Age of Onset   Depression Mother    Anxiety disorder Mother    Bipolar disorder Mother    Depression Father    Anxiety disorder Father    Depression Maternal Grandmother    Anxiety disorder Maternal Grandmother    Alcohol abuse Maternal Grandfather    Alcohol abuse Maternal Uncle     Social History Social History   Tobacco Use   Smoking status: Never   Smokeless tobacco: Former    Quit date: 09/22/2018   Tobacco comments:    previously 1/3 can dipping 2 years  Vaping Use   Vaping Use: Every day  Substance Use Topics   Alcohol use: Never   Drug use: Yes    Types: Marijuana     Allergies   Seasonal ic [cholestatin]   Review of Systems Review of Systems  Constitutional:  Negative for fever.  HENT:  Positive for congestion, rhinorrhea and sore throat. Negative for ear pain.   Respiratory:  Positive for cough. Negative for shortness of breath and wheezing.   Hematological: Negative.   Psychiatric/Behavioral: Negative.      Physical Exam Triage Vital Signs ED Triage Vitals  Enc Vitals Group     BP 07/01/21 1257 119/69  Pulse Rate 07/01/21 1257 63     Resp 07/01/21 1257 15     Temp 07/01/21 1257 98.2 F (36.8 C)     Temp Source 07/01/21 1257 Oral     SpO2 07/01/21 1257 96 %     Weight 07/01/21 1254 261 lb 11 oz (118.7 kg)     Height 07/01/21 1254 6' (1.829 m)     Head Circumference --      Peak Flow --      Pain Score 07/01/21 1254 2     Pain Loc --      Pain Edu? --      Excl. in [place]? --    No data found.  Updated Vital Signs BP 119/69 (BP Location: Left Arm)    Pulse 63    Temp 98.2 F (36.8 C) (Oral)    Resp 15    Ht 6' (1.829 m)    Wt 261 lb 11 oz (118.7 kg)    SpO2 96%    BMI 35.49 kg/m   Visual Acuity Right Eye Distance:   Left Eye Distance:   Bilateral Distance:    Right Eye Near:   Left Eye  Near:    Bilateral Near:     Physical Exam Vitals and nursing note reviewed.  Constitutional:      Appearance: Normal appearance. He is not ill-appearing.  HENT:     Head: Normocephalic and atraumatic.     Right Ear: Tympanic membrane, ear canal and external ear normal. There is no impacted cerumen.     Left Ear: Tympanic membrane, ear canal and external ear normal. There is no impacted cerumen.     Nose: Congestion and rhinorrhea present.     Mouth/Throat:     Mouth: Mucous membranes are moist.     Pharynx: Oropharynx is clear. Posterior oropharyngeal erythema present. No oropharyngeal exudate.  Cardiovascular:     Rate and Rhythm: Normal rate and regular rhythm.     Pulses: Normal pulses.     Heart sounds: Normal heart sounds. No murmur heard.   No friction rub. No gallop.  Pulmonary:     Effort: Pulmonary effort is normal.     Breath sounds: Normal breath sounds. No wheezing, rhonchi or rales.  Musculoskeletal:     Cervical back: Normal range of motion and neck supple.  Lymphadenopathy:     Cervical: No cervical adenopathy.  Skin:    General: Skin is warm and dry.     Capillary Refill: Capillary refill takes less than 2 seconds.     Findings: No erythema or rash.  Neurological:     General: No focal deficit present.     Mental Status: He is alert and oriented to person, place, and time.  Psychiatric:        Mood and Affect: Mood normal.        Behavior: Behavior normal.        Thought Content: Thought content normal.        Judgment: Judgment normal.     UC Treatments / Results  Labs (all labs ordered are listed, but only abnormal results are displayed) Labs Reviewed  GROUP A STREP BY PCR    EKG   Radiology No results found.  Procedures Procedures (including critical care time)  Medications Ordered in UC Medications - No data to display  Initial Impression / Assessment and Plan / UC Course  I have reviewed the triage vital signs and the nursing  notes.  Pertinent labs &  imaging results that were available during my care of the patient were reviewed by me and considered in my medical decision making (see chart for details).  Patient is a very pleasant, nontoxic-appearing 21 year old male here for evaluation of sore throat that started this morning when he woke up.  He does have other upper respiratory symptoms which he has been attributing to allergies as he typically has a runny nose nasal congestion this time a year.  No fever.  He does have a cough that is intermittently productive but is mostly dry.  Physical exam reveals pearly-gray tympanic membranes bilaterally with normal light reflex and clear external auditory canals.  Nasal mucosa is erythematous and edematous with scant clear discharge in both nares.  Oropharyngeal exam reveals 2+ edematous tonsillar pillars that are also erythematous without exudate.  No cervical of adenopathy appreciated on exam.  Cardiopulmonary exam feels clung sounds in all fields.  Strep PCR was collected at triage and is pending.  Strep PCR is negative.  Discharge patient home with a diagnosis of viral URI give Atrovent nasal spray to help with the nasal congestion and postnasal drip which I believe is irritating the patient's throat.   Final Clinical Impressions(s) / UC Diagnoses   Final diagnoses:  Viral upper respiratory tract infection     Discharge Instructions      Your test today did not show the presence of any strep.  Use the Atrovent nasal spray, 2 squirts up each nostril every 6 hours, help with nasal congestion, runny nose, and postnasal drip which I believe is feeding your sore throat.  Salt water 2-3 times a day to help wash away drainage and ease your sore throat pain.  Mix 1 tablespoon of table salt in 8 ounces of warm water, gargle and spit.  You can also use over-the-counter Chloraseptic or Sucrets lozenges, 1 lozenge every 2 hours, to help soothe your sore throat  pain.  Return for reevaluation for new or worsening symptoms.     ED Prescriptions     Medication Sig Dispense Auth. Provider   ipratropium (ATROVENT) 0.06 % nasal spray Place 2 sprays into both nostrils 4 (four) times daily. 15 mL Margarette Canada, NP      PDMP not reviewed this encounter.   Margarette Canada, NP 07/01/21 1423

## 2021-07-01 NOTE — Discharge Instructions (Addendum)
Your test today did not show the presence of any strep. ? ?Use the Atrovent nasal spray, 2 squirts up each nostril every 6 hours, help with nasal congestion, runny nose, and postnasal drip which I believe is feeding your sore throat. ? ?Salt water 2-3 times a day to help wash away drainage and ease your sore throat pain.  Mix 1 tablespoon of table salt in 8 ounces of warm water, gargle and spit. ? ?You can also use over-the-counter Chloraseptic or Sucrets lozenges, 1 lozenge every 2 hours, to help soothe your sore throat pain. ? ?Return for reevaluation for new or worsening symptoms. ?

## 2021-07-01 NOTE — ED Triage Notes (Signed)
Patient c/o sore throat this morning.  Patient denies fevers.  ?

## 2021-07-31 ENCOUNTER — Ambulatory Visit
Admission: EM | Admit: 2021-07-31 | Discharge: 2021-07-31 | Disposition: A | Discharge status: Home / Self Care | Payer: 59 | Attending: Physician Assistant | Admitting: Physician Assistant

## 2021-07-31 ENCOUNTER — Ambulatory Visit (INDEPENDENT_AMBULATORY_CARE_PROVIDER_SITE_OTHER): Payer: 59

## 2021-07-31 ENCOUNTER — Encounter: Payer: Self-pay | Admitting: Emergency Medicine

## 2021-07-31 ENCOUNTER — Other Ambulatory Visit: Payer: Self-pay

## 2021-07-31 DIAGNOSIS — S39012A Strain of muscle, fascia and tendon of lower back, initial encounter: Secondary | ICD-10-CM | POA: Diagnosis present

## 2021-07-31 DIAGNOSIS — M549 Dorsalgia, unspecified: Secondary | ICD-10-CM

## 2021-07-31 DIAGNOSIS — M545 Low back pain, unspecified: Secondary | ICD-10-CM | POA: Insufficient documentation

## 2021-07-31 LAB — URINALYSIS, ROUTINE W REFLEX MICROSCOPIC
Bilirubin Urine: NEGATIVE
Glucose, UA: NEGATIVE mg/dL
Hgb urine dipstick: NEGATIVE
Ketones, ur: NEGATIVE mg/dL
Leukocytes,Ua: NEGATIVE
Nitrite: NEGATIVE
Protein, ur: NEGATIVE mg/dL
Specific Gravity, Urine: 1.025 (ref 1.005–1.030)
pH: 7 (ref 5.0–8.0)

## 2021-07-31 MED ORDER — NAPROXEN 500 MG PO TABS: 500.0000 mg | ORAL_TABLET | Freq: Two times a day (BID) | ORAL | 0 refills | Status: AC

## 2021-07-31 MED ORDER — CYCLOBENZAPRINE HCL 10 MG PO TABS: 10.0000 mg | ORAL_TABLET | Freq: Two times a day (BID) | ORAL | 0 refills | Status: AC | PRN

## 2021-07-31 NOTE — Discharge Instructions (Signed)

## 2021-07-31 NOTE — ED Triage Notes (Signed)
Pt c/o lower back pain. Started yesterday morning. He states he has had kidney stones in the past and feels similar. He states sometimes he has dysuria if he can urinate.  ?

## 2021-07-31 NOTE — ED Provider Notes (Signed)
?[facility] ? ? ? ?CSN: [id number] ?Arrival date & time: 07/31/21  0809 ? ? ?  ? ?History   ?Chief Complaint ?Chief Complaint  ?Patient presents with  ? Back Pain  ? ? ?HPI ?Kevin Moody is a 21 y.o. male presents for approximately 24-hour history of bilateral lower back pain which is worse on the left side.  Increased pain when he tries to urinate.  Patient reports difficulty urinating and occasional dysuria.  He says he has had some gross hematuria in the past 1 month.  Patient denies any injuries.  He does seem to have increased pain when extending back, flexing and lateral flexion.  Patient denies any radiation of pain.  Denies abdominal pain or lower extremity pain.  No associated fever, chills, nausea/vomiting.  No numbness or tingling.  Patient has not taken anything for pain relief because he says he does not like to take any medication.  Patient concerned about possible kidney stone.  No other complaints. ? ?HPI ? ?Past Medical History:  ?Diagnosis Date  ? ADHD   ? Allergy   ? Anxiety   ? Depression   ? GERD (gastroesophageal reflux disease)   ? ? ?Patient Active Problem List  ? Diagnosis Date Noted  ? Obesity (BMI 35.0-39.9 without comorbidity) 11/02/2019  ? Mixed hyperlipidemia 11/02/2019  ? Attention deficit hyperactivity disorder (ADHD), combined type 09/29/2019  ? Gastroesophageal reflux disease 09/29/2019  ? Major depressive disorder, recurrent, in partial remission ([facility]) 09/29/2019  ? GAD (generalized anxiety disorder) 09/29/2019  ? ? ?Past Surgical History:  ?Procedure Laterality Date  ? TYMPANOSTOMY TUBE PLACEMENT Bilateral 2004  ? repeat 2007  ? ? ? ? ? ?Home Medications   ? ?Prior to Admission medications   ?Medication Sig Start Date End Date Taking? Authorizing Provider  ?cyclobenzaprine (FLEXERIL) 10 MG tablet Take 1 tablet (10 mg total) by mouth 2 (two) times daily as needed for up to 7 days for muscle spasms. 07/31/21 08/07/21 Yes Eusebio Friendly B, PA-C  ?naproxen (NAPROSYN) 500 MG  tablet Take 1 tablet (500 mg total) by mouth 2 (two) times daily for 7 days. 07/31/21 08/07/21 Yes Eusebio Friendly B, PA-C  ?acetaminophen (TYLENOL) 500 MG tablet Take by mouth.    [provider]  ?ipratropium (ATROVENT) 0.06 % nasal spray Place 2 sprays into both nostrils 4 (four) times daily. 07/01/21   Becky Augusta, NP  ?Omega-3 1000 MG CAPS Take 1,000 mg by mouth 2 (two) times daily with a meal.    [provider]  ?omeprazole (PRILOSEC) 20 MG capsule Take 1 capsule (20 mg total) by mouth daily before breakfast. For 4-6 weeks then taper down off gradually 09/29/19   Smitty Cords, DO  ? ? ?Family History ?Family History  ?Problem Relation Age of Onset  ? Depression Mother   ? Anxiety disorder Mother   ? Bipolar disorder Mother   ? Depression Father   ? Anxiety disorder Father   ? Depression Maternal Grandmother   ? Anxiety disorder Maternal Grandmother   ? Alcohol abuse Maternal Grandfather   ? Alcohol abuse Maternal Uncle   ? ? ?Social History ?Social History  ? ?Tobacco Use  ? Smoking status: Never  ? Smokeless tobacco: Former  ?  Quit date: 09/22/2018  ? Tobacco comments:  ?  previously 1/3 can dipping 2 years  ?Vaping Use  ? Vaping Use: Every day  ?Substance Use Topics  ? Alcohol use: Never  ? Drug use: Yes  ?  Types: Marijuana  ? ? ? ?Allergies   ?Seasonal ic [cholestatin] ? ? ?Review of Systems ?Review of Systems  ?Constitutional:  Negative for fatigue and fever.  ?Cardiovascular:  Negative for chest pain.  ?Gastrointestinal:  Negative for abdominal pain, diarrhea, nausea and vomiting.  ?Genitourinary:  Positive for difficulty urinating, dysuria and flank pain. Negative for frequency, genital sores, hematuria, penile discharge, penile pain, penile swelling, scrotal swelling, testicular pain and urgency.  ?Musculoskeletal:  Positive for back pain. Negative for arthralgias.  ?Skin:  Negative for rash.  ?Neurological:  Negative for weakness and numbness.  ? ? ?Physical Exam ?Triage Vital  Signs ?ED Triage Vitals  ?Enc Vitals Group  ?   BP   ?   Pulse   ?   Resp   ?   Temp   ?   Temp src   ?   SpO2   ?   Weight   ?   Height   ?   Head Circumference   ?   Peak Flow   ?   Pain Score   ?   Pain Loc   ?   Pain Edu?   ?   Excl. in [place]?   ? ?No data found. ? ?Updated Vital Signs ?BP 133/87 (BP Location: Left Arm)   Pulse 68   Temp 98.1 ?F (36.7 ?C) (Oral)   Resp 18   Ht 6' (1.829 m)   Wt 261 lb 11 oz (118.7 kg)   SpO2 99%   BMI 35.49 kg/m?  ?   ? ?Physical Exam ?Vitals and nursing note reviewed.  ?Constitutional:   ?   General: He is not in acute distress. ?   Appearance: Normal appearance. He is well-developed. He is not ill-appearing.  ?HENT:  ?   Head: Normocephalic and atraumatic.  ?Eyes:  ?   General: No scleral icterus. ?   Conjunctiva/sclera: Conjunctivae normal.  ?Cardiovascular:  ?   Rate and Rhythm: Normal rate and regular rhythm.  ?   Heart sounds: Normal heart sounds.  ?Pulmonary:  ?   Effort: Pulmonary effort is normal. No respiratory distress.  ?   Breath sounds: Normal breath sounds.  ?Abdominal:  ?   Palpations: Abdomen is soft.  ?   Tenderness: There is no abdominal tenderness. There is no right CVA tenderness or left CVA tenderness.  ?Musculoskeletal:  ?   Cervical back: Neck supple.  ?   Comments: Tenderness to palpation bilateral paravertebral lumbar muscles, greater on the left side.  Patient also has spinal tenderness about L5-S1.  Full range of motion of back but has increased pain on the left lower side of back when forward flexing and extending as well as lateral flexion toward the right side.  Negative straight leg raise, reports increased pain in the lower back when raising the left leg.  5/5 strength lower extremities.  ?Skin: ?   General: Skin is warm and dry.  ?   Capillary Refill: Capillary refill takes less than 2 seconds.  ?Neurological:  ?   General: No focal deficit present.  ?   Mental Status: He is alert. Mental status is at baseline.  ?   Motor: No weakness.  ?    Coordination: Coordination normal.  ?   Gait: Gait normal.  ?Psychiatric:     ?   Mood and Affect: Mood normal.     ?   Behavior: Behavior normal.     ?   Thought Content: Thought content normal.  ? ? ? ?  UC Treatments / Results  ?Labs ?(all labs ordered are listed, but only abnormal results are displayed) ?Labs Reviewed  ?URINALYSIS, ROUTINE W REFLEX MICROSCOPIC  ? ? ?EKG ? ? ?Radiology ?DG Abdomen 1 View ? ?Result Date: 07/31/2021 ?CLINICAL DATA:  Back pain, history renal stones EXAM: ABDOMEN - 1 VIEW COMPARISON:  None. FINDINGS: The bowel gas pattern is normal. No radio-opaque calculi or other significant radiographic abnormality are seen. IMPRESSION: Negative. Electronically Signed   By: Allegra Lai M.D.   On: 07/31/2021 09:16   ? ?Procedures ?Procedures (including critical care time) ? ?Medications Ordered in UC ?Medications - No data to display ? ?Initial Impression / Assessment and Plan / UC Course  ?I have reviewed the triage vital signs and the nursing notes. ? ?Pertinent labs & imaging results that were available during my care of the patient were reviewed by me and considered in my medical decision making (see chart for details). ? ?21 year old male presenting for concerns regarding possible nephrolithiasis.  Has a history of nephrolithiasis.  Reporting a 1 day history of bilateral lower back pain which is worse on the left side.  He believes it may be associated with some dysuria and difficulty urinating.  Increased pain with movement. ? ?Vitals normal and stable patient is overall well-appearing.  Chest clear to auscultation heart regular rate and rhythm.  No abdominal tenderness or CVA tenderness.  Patient does have tenderness palpation of bilateral vertebral lumbar muscles, worse on the left side.  Full range of motion of back but has some increased discomfort with certain movements including extension, flexion and lateral flexion to the right.  Negative straight leg raise. ? ?UA obtained and KUB  obtained as well given history.  Work-up for possible nephrolithiasis. ? ?UA is within normal limits.  KUB negative.  Discussed results with patient. ? ?Advised patient symptoms consistent with musculoskeleta

## 2021-11-13 ENCOUNTER — Ambulatory Visit (INDEPENDENT_AMBULATORY_CARE_PROVIDER_SITE_OTHER): Payer: 59 | Admitting: Family Medicine

## 2021-11-13 ENCOUNTER — Encounter: Payer: Self-pay | Admitting: Family Medicine

## 2021-11-13 VITALS — BP 134/76 | HR 76 | Ht 72.0 in | Wt 234.2 lb

## 2021-11-13 DIAGNOSIS — R309 Painful micturition, unspecified: Secondary | ICD-10-CM

## 2021-11-13 LAB — POCT URINALYSIS DIPSTICK
Bilirubin, UA: NEGATIVE
Blood, UA: NEGATIVE
Glucose, UA: NEGATIVE
Ketones, UA: NEGATIVE
Leukocytes, UA: NEGATIVE
Nitrite, UA: NEGATIVE
Protein, UA: NEGATIVE
Spec Grav, UA: 1.02 (ref 1.010–1.025)
Urobilinogen, UA: 0.2 E.U./dL
pH, UA: 5 (ref 5.0–8.0)

## 2021-11-13 NOTE — Progress Notes (Signed)
Subjective:    Patient ID: Kevin Moody, male    DOB: 09-09-2000, 21 y.o.   MRN: [id number]  Kevin Moody is a 21 y.o. male presenting on 11/13/2021 for Nephrolithiasis and painful urination   HPI  Urinary Symptoms Reports symptoms started 1 month ago, he would go urinate and he said he would feel pain and pressure with urination, and strain or push more to urinate. He had this problem fairly persistent for month and then seems to have resolved.  Prior history of nephrolithiasis. Felt similar to but not quite - because now he does not have any back pain with it. In the past with stones he had back pain.  Today he has voided well without problem, no pain or pressure. No passed stone he has identified.  Denies hematuria, fever chills, back or flank pain, nausea vomiting      04/11/2021    9:31 AM 11/02/2019   12:29 PM 09/29/2019    3:38 PM  Depression screen PHQ 2/9  Decreased Interest 0 0 0  Down, Depressed, Hopeless 0 0 0  PHQ - 2 Score 0 0 0  Altered sleeping 0 0 0  Tired, decreased energy 0 0 1  Change in appetite 0 0 0  Feeling bad or failure about yourself  0 0 0  Trouble concentrating 0 0 1  Moving slowly or fidgety/restless 0 0 1  Suicidal thoughts 0 0 0  PHQ-9 Score 0 0 3  Difficult doing work/chores Not difficult at all Not difficult at all Not difficult at all    Social History   Tobacco Use   Smoking status: Never   Smokeless tobacco: Former    Quit date: 09/22/2018   Tobacco comments:    previously 1/3 can dipping 2 years  Vaping Use   Vaping Use: Every day  Substance Use Topics   Alcohol use: Never   Drug use: Yes    Types: Marijuana    Review of Systems Per HPI unless specifically indicated above     Objective:    BP 134/76   Pulse 76   Ht 6' (1.829 m)   Wt 234 lb 3.2 oz (106.2 kg)   SpO2 99%   BMI 31.76 kg/m   Wt Readings from Last 3 Encounters:  11/13/21 234 lb 3.2 oz (106.2 kg)  07/31/21 261 lb 11 oz (118.7 kg)  07/01/21 261 lb 11  oz (118.7 kg)    Physical Exam Vitals and nursing note reviewed.  Constitutional:      General: He is not in acute distress.    Appearance: Normal appearance. He is well-developed. He is not diaphoretic.     Comments: Well-appearing, comfortable, cooperative  HENT:     Head: Normocephalic and atraumatic.  Eyes:     General:        Right eye: No discharge.        Left eye: No discharge.     Conjunctiva/sclera: Conjunctivae normal.  Cardiovascular:     Rate and Rhythm: Normal rate.  Pulmonary:     Effort: Pulmonary effort is normal.  Skin:    General: Skin is warm and dry.     Findings: No erythema or rash.  Neurological:     Mental Status: He is alert and oriented to person, place, and time.  Psychiatric:        Mood and Affect: Mood normal.        Behavior: Behavior normal.  Thought Content: Thought content normal.     Comments: Well groomed, good eye contact, normal speech and thoughts     I have personally reviewed the radiology report from X-ray KUB 07/31/21  CLINICAL DATA:  Back pain, history renal stones   EXAM: ABDOMEN - 1 VIEW   COMPARISON:  None.   FINDINGS: The bowel gas pattern is normal. No radio-opaque calculi or other significant radiographic abnormality are seen.   IMPRESSION: Negative.     Electronically Signed   By: Allegra Lai M.D.   On: 07/31/2021 09:16  Results for orders placed or performed in visit on 11/13/21  POCT Urinalysis Dipstick  Result Value Ref Range   Color, UA Yellow    Clarity, UA Clear    Glucose, UA Negative Negative   Bilirubin, UA Negative    Ketones, UA Negative    Spec Grav, UA 1.020 1.010 - 1.025   Blood, UA Negative    pH, UA 5.0 5.0 - 8.0   Protein, UA Negative Negative   Urobilinogen, UA 0.2 0.2 or 1.0 E.U./dL   Nitrite, UA Negative    Leukocytes, UA Negative Negative   Appearance     Odor        Assessment & Plan:   Problem List Items Addressed This Visit   None Visit Diagnoses      Painful urination    -  Primary   Relevant Orders   POCT Urinalysis Dipstick (Completed)      Sounds most likely a Urethral Stricture which can occur fairly spontaneously and limit the urinary flow cause discomfort etc. UA negative dipstick. Defer culture KUB X-ray negative for stones 3 month ago it does not sound classic for kidney stone or urinary infection.  Now resolved.  Try to drink plenty of water to keep urine flowing and avoid issues in future.  If recurrence  of the same thing or something new or different, we can refer to a Urologist.   No orders of the defined types were placed in this encounter.   Follow up plan: Return if symptoms worsen or fail to improve.  Saralyn Pilar, DO [facility] 11/13/2021, 11:32 AM

## 2021-11-13 NOTE — Patient Instructions (Addendum)
Thank you for coming to the office today.  Sounds most likely a Urethral Stricture which can occur fairly spontaneously and limit the urinary flow cause discomfort etc.  I agree it does not sound classic for kidney stone or urinary infection.  Try to drink plenty of water to keep urine flowing and avoid issues in future.  If recurrence  of the same thing or something new or different, we can refer to a Urologist.  Results for orders placed or performed in visit on 11/13/21  POCT Urinalysis Dipstick  Result Value Ref Range   Color, UA Yellow    Clarity, UA Clear    Glucose, UA Negative Negative   Bilirubin, UA Negative    Ketones, UA Negative    Spec Grav, UA 1.020 1.010 - 1.025   Blood, UA Negative    pH, UA 5.0 5.0 - 8.0   Protein, UA Negative Negative   Urobilinogen, UA 0.2 0.2 or 1.0 E.U./dL   Nitrite, UA Negative    Leukocytes, UA Negative Negative   Appearance     Odor       Please schedule a Follow-up Appointment to: Return if symptoms worsen or fail to improve.  If you have any other questions or concerns, please feel free to call the office or send a message through MyChart. You may also schedule an earlier appointment if necessary.  Additionally, you may be receiving a survey about your experience at our office within a few days to 1 week by e-mail or mail. We value your feedback.  Saralyn Pilar, DO [facility], [place]

## 2021-11-27 ENCOUNTER — Ambulatory Visit: Payer: Self-pay | Admitting: Nurse Practitioner

## 2021-11-27 ENCOUNTER — Encounter: Payer: Self-pay | Admitting: Nurse Practitioner

## 2021-11-27 DIAGNOSIS — Z113 Encounter for screening for infections with a predominantly sexual mode of transmission: Secondary | ICD-10-CM

## 2021-11-27 DIAGNOSIS — Z202 Contact with and (suspected) exposure to infections with a predominantly sexual mode of transmission: Secondary | ICD-10-CM

## 2021-11-27 LAB — GRAM STAIN

## 2021-11-27 LAB — HEPATITIS B SURFACE ANTIGEN: Hepatitis B Surface Ag: NONREACTIVE

## 2021-11-27 LAB — HM HEPATITIS C SCREENING LAB: HM Hepatitis Screen: NEGATIVE

## 2021-11-27 LAB — HM HIV SCREENING LAB: HM HIV Screening: NEGATIVE

## 2021-11-27 MED ORDER — DOXYCYCLINE HYCLATE 100 MG PO TABS: 100.0000 mg | ORAL_TABLET | Freq: Two times a day (BID) | ORAL | 0 refills | Status: DC

## 2021-11-27 NOTE — Progress Notes (Signed)
[facility] Department STI clinic/screening visit  Subjective:  Kevin Moody is a 21 y.o. male being seen today for an STI screening visit. The patient reports they do have symptoms.    Patient has the following medical conditions:   Patient Active Problem List   Diagnosis Date Noted   Obesity (BMI 35.0-39.9 without comorbidity) 11/02/2019   Mixed hyperlipidemia 11/02/2019   Attention deficit hyperactivity disorder (ADHD), combined type 09/29/2019   Gastroesophageal reflux disease 09/29/2019   Major depressive disorder, recurrent, in partial remission ([place]) 09/29/2019   GAD (generalized anxiety disorder) 09/29/2019     Chief Complaint  Patient presents with   SEXUALLY TRANSMITTED DISEASE    Screening -patient was exposed to chlamydia patient states he is burning when urinating and has some discharge     HPI  Patient reports to clinic today for STD screening.  Patient reports that he is a contact to Chlamydia.  Patient also reports discharge and dysuria that has been present for 3 weeks.    Does the patient or their partner desires a pregnancy in the next year? No  Screening for MPX risk: Does the patient have an unexplained rash? No Is the patient MSM? No Does the patient endorse multiple sex partners or anonymous sex partners? No Did the patient have close or sexual contact with a person diagnosed with MPX? No Has the patient traveled outside the [place] where MPX is endemic? No Is there a high clinical suspicion for MPX-- evidenced by one of the following No  -Unlikely to be chickenpox  -Lymphadenopathy  -Rash that present in same phase of evolution on any given body part   See flowsheet for further details and programmatic requirements.   Immunization History  Administered Date(s) Administered   HPV 9-valent 10/07/2018   HPV Quadrivalent 03/19/2018   Hepatitis A 02/18/2006, 08/23/2006   Hepatitis B July 25, 1999, 02/29/2000, 10/23/2000   MMR 02/28/2001,  02/18/2004   Meningococcal Conjugate 03/19/2018, 10/07/2018   Pneumococcal Conjugate,unspecified 03/29/2000, 06/05/2000, 07/26/2000, 05/13/2001   Tdap 10/22/2015   Varicella 02/28/2001, 02/19/2011     The following portions of the patient's history were reviewed and updated as appropriate: allergies, current medications, past medical history, past social history, past surgical history and problem list.  Objective:  There were no vitals filed for this visit.  Physical Exam Constitutional:      Appearance: Normal appearance.  HENT:     Head: Normocephalic. No abrasion, masses or laceration. Hair is normal.     Right Ear: External ear normal.     Left Ear: External ear normal.     Nose: Nose normal.     Mouth/Throat:     Lips: Pink.     Mouth: Mucous membranes are moist. No oral lesions.     Dentition: No dental caries.     Pharynx: No pharyngeal swelling, oropharyngeal exudate, posterior oropharyngeal erythema or uvula swelling.     Tonsils: No tonsillar exudate or tonsillar abscesses.  Eyes:     General: Lids are normal.        Right eye: No discharge.        Left eye: No discharge.     Conjunctiva/sclera: Conjunctivae normal.     Right eye: No exudate.    Left eye: No exudate. Abdominal:     General: Abdomen is flat.     Palpations: Abdomen is soft.     Tenderness: There is no abdominal tenderness. There is no rebound.  Genitourinary:    Pubic Area: No  rash or pubic lice.      Penis: Normal and circumcised. No erythema or discharge.      Testes: Normal.        Right: Mass or tenderness not present.        Left: Mass or tenderness not present.     Rectum: Normal.     Comments: Discharge amount: none Color: none  Musculoskeletal:     Cervical back: Full passive range of motion without pain, normal range of motion and neck supple.  Lymphadenopathy:     Cervical: No cervical adenopathy.     Right cervical: No superficial, deep or posterior cervical adenopathy.    Left  cervical: No superficial, deep or posterior cervical adenopathy.     Upper Body:     Right upper body: No supraclavicular, axillary or epitrochlear adenopathy.     Left upper body: No supraclavicular, axillary or epitrochlear adenopathy.     Lower Body: No right inguinal adenopathy. No left inguinal adenopathy.  Skin:    General: Skin is warm and dry.     Findings: No lesion or rash.  Neurological:     Mental Status: He is alert and oriented to person, place, and time.  Psychiatric:        Attention and Perception: Attention normal.        Mood and Affect: Mood normal.        Speech: Speech normal.        Behavior: Behavior normal. Behavior is cooperative.       Assessment and Plan:  Kevin Moody is a 21 y.o. male presenting to the [facility] Department for STI screening  1. Screening examination for venereal disease -21 year old male in clinic today for STD screening.   -Patient does have STI symptoms Patient accepted all screenings including oral GC, urine CT/GC and bloodwork for HIV/RPR.  Patient meets criteria for HepB screening? Yes. Ordered? Yes Patient meets criteria for HepC screening? Yes. Ordered? Yes Recommended condom use with all sex Discussed importance of condom use for STI prevent  Treat gram stain per standing order Discussed time line for State [facility] results and that patient will be called with positive results and encouraged patient to call if he had not heard in 2 weeks Recommended returning for continued or worsening symptoms.    - HIV/HCV [facility] - Syphilis Serology, [facility] - HBV Antigen/Antibody [facility] - Gram stain - Gonococcus culture - Chlamydia/GC NAA, Confirmation  2. Exposure to chlamydia -Treat patient as a contact to Chlamydia.   - doxycycline (VIBRA-TABS) 100 MG tablet; Take 1 tablet (100 mg total) by mouth 2 (two) times daily.  Dispense: 14 tablet; Refill: 0   Total time spent: 30 minutes   Return if symptoms  worsen or fail to improve.  Future Appointments  Date Time Provider [place]  11/27/2021 10:00 AM Gregary Cromer, FNP [facility]STI None  04/12/2022  8:00 AM Karamalegos, Devonne Doughty, DO [facility]    Gregary Cromer, FNP

## 2021-11-27 NOTE — Progress Notes (Signed)
Pt here for STI screening and as contact to Chlamydia.  Gram stain results reviewed.  Doxycycline 100mg  #14, 1 BID x 7 days provided.  Pt. Counseled on medication, side effects, and when to contact clinic for questions or concerns.  Verbalizes understanding of how to take medication and voices no other concerns at this time.  Condoms declined.- , RN

## 2021-12-01 LAB — C. TRACHOMATIS NAA, CONFIRM: C. trachomatis NAA, Confirm: POSITIVE — AB

## 2021-12-01 LAB — CHLAMYDIA/GC NAA, CONFIRMATION
Chlamydia trachomatis, NAA: POSITIVE — AB
Neisseria gonorrhoeae, NAA: NEGATIVE

## 2021-12-02 LAB — GONOCOCCUS CULTURE

## 2021-12-04 ENCOUNTER — Telehealth: Payer: Self-pay

## 2021-12-04 NOTE — Telephone Encounter (Signed)
PC to pt to advise of + test results for Chlamydia. 8/7 seen at [facility] and treated as a contact to CL w/ Doxycycline 100mg  BID x7days . LMTRC.

## 2021-12-05 ENCOUNTER — Telehealth: Payer: Self-pay

## 2021-12-05 NOTE — Telephone Encounter (Signed)
Attempt #2 to contact pt regarding test results 8/7 for chlamydia. Treated as contact on 8/7. LMTRC.

## 2022-04-11 ENCOUNTER — Emergency Department
Admission: EM | Admit: 2022-04-11 | Discharge: 2022-04-11 | Disposition: A | Discharge status: Home / Self Care | Payer: 59 | Attending: Emergency Medicine | Admitting: Emergency Medicine

## 2022-04-11 ENCOUNTER — Emergency Department: Payer: 59

## 2022-04-11 ENCOUNTER — Other Ambulatory Visit: Payer: Self-pay

## 2022-04-11 DIAGNOSIS — S161XXA Strain of muscle, fascia and tendon at neck level, initial encounter: Secondary | ICD-10-CM | POA: Insufficient documentation

## 2022-04-11 DIAGNOSIS — M79641 Pain in right hand: Secondary | ICD-10-CM | POA: Diagnosis not present

## 2022-04-11 DIAGNOSIS — S060X0A Concussion without loss of consciousness, initial encounter: Secondary | ICD-10-CM | POA: Diagnosis not present

## 2022-04-11 DIAGNOSIS — M545 Low back pain, unspecified: Secondary | ICD-10-CM | POA: Insufficient documentation

## 2022-04-11 DIAGNOSIS — M25531 Pain in right wrist: Secondary | ICD-10-CM | POA: Insufficient documentation

## 2022-04-11 DIAGNOSIS — Y92410 Unspecified street and highway as the place of occurrence of the external cause: Secondary | ICD-10-CM | POA: Diagnosis not present

## 2022-04-11 DIAGNOSIS — R519 Headache, unspecified: Secondary | ICD-10-CM | POA: Diagnosis present

## 2022-04-11 MED ORDER — OXYCODONE-ACETAMINOPHEN 5-325 MG PO TABS
1.0000 | ORAL_TABLET | Freq: Once | ORAL | Status: AC
  Administered 2022-04-11: 1 via ORAL
  Filled 2022-04-11: qty 1
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
## 2022-04-11 NOTE — Discharge Instructions (Signed)
Take ibuprofen either 400 mg or 600 mg every 6 hours as needed for pain.  Return to the ER for new, worsening, or persistent severe headache, dizziness, vomiting, weakness or numbness, or any other new or worsening symptoms that concern you.
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
## 2022-04-12 ENCOUNTER — Encounter: Payer: 59 | Admitting: Family Medicine

## 2022-05-01 ENCOUNTER — Ambulatory Visit
Admission: EM | Admit: 2022-05-01 | Discharge: 2022-05-01 | Disposition: A | Discharge status: Home / Self Care | Payer: 59 | Attending: Emergency Medicine | Admitting: Emergency Medicine

## 2022-05-01 DIAGNOSIS — K219 Gastro-esophageal reflux disease without esophagitis: Secondary | ICD-10-CM | POA: Insufficient documentation

## 2022-05-01 DIAGNOSIS — E782 Mixed hyperlipidemia: Secondary | ICD-10-CM | POA: Diagnosis not present

## 2022-05-01 DIAGNOSIS — J069 Acute upper respiratory infection, unspecified: Secondary | ICD-10-CM | POA: Diagnosis not present

## 2022-05-01 DIAGNOSIS — Z20822 Contact with and (suspected) exposure to covid-19: Secondary | ICD-10-CM | POA: Insufficient documentation

## 2022-05-01 DIAGNOSIS — E669 Obesity, unspecified: Secondary | ICD-10-CM | POA: Insufficient documentation

## 2022-05-01 DIAGNOSIS — Z1152 Encounter for screening for COVID-19: Secondary | ICD-10-CM | POA: Diagnosis not present

## 2022-05-01 DIAGNOSIS — Z6832 Body mass index (BMI) 32.0-32.9, adult: Secondary | ICD-10-CM | POA: Insufficient documentation

## 2022-05-01 DIAGNOSIS — R059 Cough, unspecified: Secondary | ICD-10-CM | POA: Diagnosis present

## 2022-05-01 LAB — RESP PANEL BY RT-PCR (RSV, FLU A&B, COVID)  RVPGX2
Influenza A by PCR: NEGATIVE
Influenza B by PCR: NEGATIVE
Resp Syncytial Virus by PCR: NEGATIVE
SARS Coronavirus 2 by RT PCR: NEGATIVE

## 2022-05-01 MED ORDER — PROMETHAZINE-DM 6.25-15 MG/5ML PO SYRP: 5.0000 mL | ORAL_SOLUTION | Freq: Four times a day (QID) | ORAL | 0 refills | Status: DC | PRN

## 2022-05-01 MED ORDER — IPRATROPIUM BROMIDE 0.06 % NA SOLN: 2.0000 | Freq: Four times a day (QID) | NASAL | 12 refills | Status: DC

## 2022-05-01 MED ORDER — BENZONATATE 100 MG PO CAPS: 200.0000 mg | ORAL_CAPSULE | Freq: Three times a day (TID) | ORAL | 0 refills | Status: DC

## 2022-05-01 NOTE — ED Provider Notes (Signed)
[facility]    CSN: [id number] Arrival date & time: 05/01/22  0805      History   Chief Complaint Chief Complaint  Patient presents with   Nasal Congestion   Nausea   Cough    HPI Kevin Moody is a 22 y.o. male.   HPI  22 year old male here for evaluation of respiratory complaints.  The patient reports he was recently exposed to COVID and flu and that yesterday he developed nasal congestion with occasional discharge, intermittent sore throat, nonproductive cough, body aches, and headache.  He denies any measured fever, sweats or chills, ear pain, shortness of breath, wheezing, vomiting, or diarrhea.  His partner and daughter both have similar symptoms.  Patient has a significant past medical history for GERD and mixed hyperlipidemia.  Obesity, MDD and ADHD.  Past Medical History:  Diagnosis Date   ADHD    Allergy    Anxiety    Depression    GERD (gastroesophageal reflux disease)     Patient Active Problem List   Diagnosis Date Noted   Obesity (BMI 35.0-39.9 without comorbidity) 11/02/2019   Mixed hyperlipidemia 11/02/2019   Attention deficit hyperactivity disorder (ADHD), combined type 09/29/2019   Gastroesophageal reflux disease 09/29/2019   Major depressive disorder, recurrent, in partial remission ([facility]) 09/29/2019   GAD (generalized anxiety disorder) 09/29/2019    Past Surgical History:  Procedure Laterality Date   TYMPANOSTOMY TUBE PLACEMENT Bilateral 2004   repeat 2007       Home Medications    Prior to Admission medications   Medication Sig Start Date End Date Taking? Authorizing Provider  acetaminophen (TYLENOL) 500 MG tablet Take by mouth.   Yes [provider]  benzonatate (TESSALON) 100 MG capsule Take 2 capsules (200 mg total) by mouth every 8 (eight) hours. 05/01/22  Yes Becky Augusta, NP  doxycycline (VIBRA-TABS) 100 MG tablet Take 1 tablet (100 mg total) by mouth 2 (two) times daily. 11/27/21  Yes Glenna Fellows, FNP   ipratropium (ATROVENT) 0.06 % nasal spray Place 2 sprays into both nostrils 4 (four) times daily. 05/01/22  Yes Becky Augusta, NP  omeprazole (PRILOSEC) 20 MG capsule Take 1 capsule (20 mg total) by mouth daily before breakfast. For 4-6 weeks then taper down off gradually 09/29/19  Yes Karamalegos, Netta Neat, DO  promethazine-dextromethorphan (PROMETHAZINE-DM) 6.25-15 MG/5ML syrup Take 5 mLs by mouth 4 (four) times daily as needed. 05/01/22  Yes Becky Augusta, NP  Omega-3 1000 MG CAPS Take 1,000 mg by mouth 2 (two) times daily with a meal. Patient not taking: Reported on 11/27/2021    [provider]    Family History Family History  Problem Relation Age of Onset   Depression Mother    Anxiety disorder Mother    Bipolar disorder Mother    Depression Father    Anxiety disorder Father    Depression Maternal Grandmother    Anxiety disorder Maternal Grandmother    Alcohol abuse Maternal Grandfather    Alcohol abuse Maternal Uncle     Social History Social History   Tobacco Use   Smoking status: Never   Smokeless tobacco: Current    Types: Snuff    Last attempt to quit: 09/22/2018  Vaping Use   Vaping Use: Former  Substance Use Topics   Alcohol use: Never   Drug use: Yes    Types: Marijuana, "Crack" cocaine    Comment: 5-6 times a day, history of last use of cocaine 04/2021     Allergies  Seasonal ic [cholestatin]   Review of Systems Review of Systems  Constitutional:  Negative for chills, diaphoresis and fever.  HENT:  Positive for congestion, rhinorrhea and sore throat. Negative for ear pain.   Respiratory:  Positive for cough. Negative for shortness of breath and wheezing.   Gastrointestinal:  Positive for nausea. Negative for diarrhea and vomiting.  Musculoskeletal:  Positive for arthralgias and myalgias.  Neurological:  Positive for headaches.  Hematological: Negative.   Psychiatric/Behavioral: Negative.       Physical Exam Triage Vital Signs ED Triage  Vitals  Enc Vitals Group     BP 05/01/22 0823 130/78     Pulse Rate 05/01/22 0823 73     Resp 05/01/22 0823 16     Temp --      Temp Source 05/01/22 0823 Tympanic     SpO2 05/01/22 0823 95 %     Weight 05/01/22 0822 240 lb (108.9 kg)     Height 05/01/22 0822 6' (1.829 m)     Head Circumference --      Peak Flow --      Pain Score 05/01/22 0822 0     Pain Loc --      Pain Edu? --      Excl. in [place]? --    No data found.  Updated Vital Signs BP 130/78 (BP Location: Right Arm)   Pulse 73   Temp 98.2 F (36.8 C) (Tympanic)   Resp 16   Ht 6' (1.829 m)   Wt 240 lb (108.9 kg)   SpO2 95%   BMI 32.55 kg/m   Visual Acuity Right Eye Distance:   Left Eye Distance:   Bilateral Distance:    Right Eye Near:   Left Eye Near:    Bilateral Near:     Physical Exam Vitals and nursing note reviewed.  Constitutional:      Appearance: Normal appearance. He is not ill-appearing.  HENT:     Head: Normocephalic and atraumatic.     Right Ear: Tympanic membrane, ear canal and external ear normal. There is no impacted cerumen.     Left Ear: Tympanic membrane, ear canal and external ear normal. There is no impacted cerumen.     Nose: Congestion and rhinorrhea present.     Comments: Nasal mucosa is erythematous and edematous with minor clear rhinorrhea bilaterally.    Mouth/Throat:     Mouth: Mucous membranes are moist.     Pharynx: Oropharynx is clear. Posterior oropharyngeal erythema present. No oropharyngeal exudate.     Comments: Bilateral tonsillar pillars are 1+ edematous and erythematous but there is no appreciable exudate.  The posterior oropharynx does reveal some erythema but no injection.  Clear postnasal drip is present on exam. Cardiovascular:     Rate and Rhythm: Normal rate and regular rhythm.     Pulses: Normal pulses.     Heart sounds: Normal heart sounds. No murmur heard.    No friction rub. No gallop.  Pulmonary:     Effort: Pulmonary effort is normal.     Breath  sounds: Normal breath sounds. No wheezing, rhonchi or rales.  Musculoskeletal:     Cervical back: Normal range of motion and neck supple.  Lymphadenopathy:     Cervical: No cervical adenopathy.  Skin:    General: Skin is warm and dry.     Capillary Refill: Capillary refill takes less than 2 seconds.  Neurological:     General: No focal deficit present.  Mental Status: He is alert and oriented to person, place, and time.  Psychiatric:        Mood and Affect: Mood normal.        Behavior: Behavior normal.        Thought Content: Thought content normal.        Judgment: Judgment normal.      UC Treatments / Results  Labs (all labs ordered are listed, but only abnormal results are displayed) Labs Reviewed  RESP PANEL BY RT-PCR (RSV, FLU A&B, COVID)  [id number]    EKG   Radiology No results found.  Procedures Procedures (including critical care time)  Medications Ordered in UC Medications - No data to display  Initial Impression / Assessment and Plan / UC Course  I have reviewed the triage vital signs and the nursing notes.  Pertinent labs & imaging results that were available during my care of the patient were reviewed by me and considered in my medical decision making (see chart for details).   The patient is a pleasant, nontoxic-appearing 22 year old male with significant past medical history for MDD, ADHD, mixed hyperlipidemia, obesity, and GERD who presents for evaluation of 1 day worth of respiratory symptoms after a COVID and flu exposure.  The patient is not in any acute distress and he is able to speak in full sentence without dyspnea or tachypnea.  He does not display a cough in the exam room nor does he have significant rhinorrhea.  He does have inflammation of his upper respiratory tree as evidenced by the inflamed nasal mucosa and inflamed tonsillar pillars and posterior pharynx.  There is clear drainage from the nose and postnasal he but nothing of purulence.   Cardiopulmonary exam reveals clear lung sounds in all fields.  Given his exposure I will order a respiratory panel to look for the presence of COVID and flu.  He is within the window and does qualify for antiviral therapy for either condition.  If he is negative for COVID and flu I will treat him for viral URI with symptomatic treatment.  Her panel is negative for COVID, RSV, or influenza.  I will discharge patient with a diagnosis of viral URI with cough and treat him with some Edgemon nasal spray to help with the nasal congestion, Tessalon Perles and Promethazine DM cough syrup help with cough and congestion.  Over-the-counter NSAIDs or Tylenol as needed for body aches and pain.   Final Clinical Impressions(s) / UC Diagnoses   Final diagnoses:  Viral URI with cough     Discharge Instructions      Your test today did not reveal the presence of COVID, flu, or RSV.  I do believe you have a viral respiratory infection however.  I would recommend wearing a mask around others to prevent spread, or contraction of worsening respiratory illness, until your symptoms have improved.  Take over-the-counter Tylenol and/or ibuprofen as needed for body aches or pain.  You can gargle with warm salt water, 1 tablespoon of table salt in 8 ounces of warm water-gargle and spit, as often as needed to soothe your throat.  Use the Atrovent nasal spray, 2 squirts in each nostril every 6 hours, as needed for runny nose and postnasal drip.  Use the Tessalon Perles every 8 hours during the day.  Take them with a small sip of water.  They may give you some numbness to the base of your tongue or a metallic taste in your mouth, this is normal.  Use  the Promethazine DM cough syrup at bedtime for cough and congestion.  It will make you drowsy so do not take it during the day.  Return for reevaluation or see your primary care provider for any new or worsening symptoms.      ED Prescriptions     Medication Sig  Dispense Auth. Provider   benzonatate (TESSALON) 100 MG capsule Take 2 capsules (200 mg total) by mouth every 8 (eight) hours. 21 capsule Becky Augusta, NP   ipratropium (ATROVENT) 0.06 % nasal spray Place 2 sprays into both nostrils 4 (four) times daily. 15 mL Becky Augusta, NP   promethazine-dextromethorphan (PROMETHAZINE-DM) 6.25-15 MG/5ML syrup Take 5 mLs by mouth 4 (four) times daily as needed. 118 mL Becky Augusta, NP      PDMP not reviewed this encounter.   Becky Augusta, NP 05/01/22 [phone]

## 2022-05-01 NOTE — ED Triage Notes (Signed)
Pt c/o nasal congestion,nausea & cough x1 day, states has been exposed to covid/flu.

## 2022-05-01 NOTE — Discharge Instructions (Signed)
Your test today did not reveal the presence of COVID, flu, or RSV.  I do believe you have a viral respiratory infection however.  I would recommend wearing a mask around others to prevent spread, or contraction of worsening respiratory illness, until your symptoms have improved.  Take over-the-counter Tylenol and/or ibuprofen as needed for body aches or pain.  You can gargle with warm salt water, 1 tablespoon of table salt in 8 ounces of warm water-gargle and spit, as often as needed to soothe your throat.  Use the Atrovent nasal spray, 2 squirts in each nostril every 6 hours, as needed for runny nose and postnasal drip.  Use the Tessalon Perles every 8 hours during the day.  Take them with a small sip of water.  They may give you some numbness to the base of your tongue or a metallic taste in your mouth, this is normal.  Use the Promethazine DM cough syrup at bedtime for cough and congestion.  It will make you drowsy so do not take it during the day.  Return for reevaluation or see your primary care provider for any new or worsening symptoms.

## 2022-06-27 ENCOUNTER — Ambulatory Visit
Admission: EM | Admit: 2022-06-27 | Discharge: 2022-06-27 | Disposition: A | Discharge status: Home / Self Care | Payer: 59 | Attending: Physician Assistant | Admitting: Physician Assistant

## 2022-06-27 DIAGNOSIS — R112 Nausea with vomiting, unspecified: Secondary | ICD-10-CM

## 2022-06-27 DIAGNOSIS — A084 Viral intestinal infection, unspecified: Secondary | ICD-10-CM | POA: Diagnosis not present

## 2022-06-27 DIAGNOSIS — R197 Diarrhea, unspecified: Secondary | ICD-10-CM

## 2022-06-27 MED ORDER — ONDANSETRON HCL 4 MG PO TABS: 4.0000 mg | ORAL_TABLET | Freq: Three times a day (TID) | ORAL | 0 refills | Status: DC | PRN

## 2022-06-27 NOTE — ED Triage Notes (Signed)
Pt is with his spouse  Pt c/o liquid diarrhea, vomiting, x3days  Pt has been using Pepto bismol and has turned his stool black.   Pt denies any recent antibiotics, diet changes, or exercises.   Pt spouse denies diarrhea or vomiting.  Pt last meal was chicken tenders from [employer] and he had diarrhea after.

## 2022-06-27 NOTE — Discharge Instructions (Signed)

## 2022-06-27 NOTE — ED Provider Notes (Signed)
[facility]    CSN: [id number] Arrival date & time: 06/27/22  [id number]      History   Chief Complaint Chief Complaint  Patient presents with   Diarrhea   Vomiting    HPI Kevin Moody is a 22 y.o. male presenting for approximately 3-day history of generalized abdominal cramping pain with nausea/vomiting and diarrhea.  Reports his episodes of diarrhea are "too much to count even with 5 sets of hands."  Denies fever, fatigue, chills, cough, congestion.  Reports sore throat after vomiting.  Orts reduced appetite.  States he has still been drinking fluids and try to drink Gatorade.  His daughter recently had nausea/vomiting and diarrhea and his wife also had vomiting but she is currently pregnant.  He has tried Pepto-Bismol the counter but says he did not think it helped.  He has not had any recent travel, antibiotic usage or eating any uncooked or spoiled foods does not.  No other complaints.  HPI  Past Medical History:  Diagnosis Date   ADHD    Allergy    Anxiety    Depression    GERD (gastroesophageal reflux disease)     Patient Active Problem List   Diagnosis Date Noted   Obesity (BMI 35.0-39.9 without comorbidity) 11/02/2019   Mixed hyperlipidemia 11/02/2019   Attention deficit hyperactivity disorder (ADHD), combined type 09/29/2019   Gastroesophageal reflux disease 09/29/2019   Major depressive disorder, recurrent, in partial remission ([place]) 09/29/2019   GAD (generalized anxiety disorder) 09/29/2019    Past Surgical History:  Procedure Laterality Date   TYMPANOSTOMY TUBE PLACEMENT Bilateral 2004   repeat 2007       Home Medications    Prior to Admission medications   Medication Sig Start Date End Date Taking? Authorizing Provider  ondansetron (ZOFRAN) 4 MG tablet Take 1 tablet (4 mg total) by mouth every 8 (eight) hours as needed for nausea or vomiting. 06/27/22  Yes Danton Clap, PA-C  Omega-3 1000 MG CAPS Take 1,000 mg by mouth 2 (two) times daily  with a meal. Patient not taking: Reported on 11/27/2021    [provider]  omeprazole (PRILOSEC) 20 MG capsule Take 1 capsule (20 mg total) by mouth daily before breakfast. For 4-6 weeks then taper down off gradually 09/29/19   Olin Hauser, DO    Family History Family History  Problem Relation Age of Onset   Depression Mother    Anxiety disorder Mother    Bipolar disorder Mother    Depression Father    Anxiety disorder Father    Depression Maternal Grandmother    Anxiety disorder Maternal Grandmother    Alcohol abuse Maternal Grandfather    Alcohol abuse Maternal Uncle     Social History Social History   Tobacco Use   Smoking status: Never   Smokeless tobacco: Former    Types: Snuff    Quit date: 09/22/2018  Vaping Use   Vaping Use: Former  Substance Use Topics   Alcohol use: Never   Drug use: Not Currently    Types: Marijuana, "Crack" cocaine    Comment: 5-6 times a day, history of last use of cocaine 04/2021     Allergies   Seasonal ic [cholestatin]   Review of Systems Review of Systems  Constitutional:  Positive for appetite change. Negative for chills, diaphoresis, fatigue and fever.  HENT:  Negative for congestion, rhinorrhea, sinus pressure, sinus pain and sore throat.   Respiratory:  Negative for cough and shortness of breath.  Cardiovascular:  Negative for chest pain.  Gastrointestinal:  Positive for abdominal pain, diarrhea, nausea and vomiting.  Musculoskeletal:  Negative for myalgias.  Neurological:  Negative for weakness, light-headedness and headaches.  Hematological:  Negative for adenopathy.     Physical Exam Triage Vital Signs ED Triage Vitals  Enc Vitals Group     BP      Pulse      Resp      Temp      Temp src      SpO2      Weight      Height      Head Circumference      Peak Flow      Pain Score      Pain Loc      Pain Edu?      Excl. in [place]?    No data found.  Updated Vital Signs BP (!) 143/89 (BP  Location: Left Arm)   Pulse 74   Temp 98.4 F (36.9 C) (Oral)   Ht 6' (1.829 m)   Wt 249 lb (112.9 kg)   SpO2 94%   BMI 33.77 kg/m    Physical Exam Vitals and nursing note reviewed.  Constitutional:      General: He is not in acute distress.    Appearance: Normal appearance. He is well-developed. He is not ill-appearing.  HENT:     Head: Normocephalic and atraumatic.     Nose: Nose normal.     Mouth/Throat:     Mouth: Mucous membranes are moist.     Pharynx: Oropharynx is clear.  Eyes:     General: No scleral icterus.    Conjunctiva/sclera: Conjunctivae normal.  Cardiovascular:     Rate and Rhythm: Normal rate and regular rhythm.     Heart sounds: Normal heart sounds.  Pulmonary:     Effort: Pulmonary effort is normal. No respiratory distress.     Breath sounds: Normal breath sounds.  Abdominal:     Palpations: Abdomen is soft.     Tenderness: There is no abdominal tenderness. There is no guarding or rebound.  Musculoskeletal:     Cervical back: Neck supple.  Skin:    General: Skin is warm and dry.     Capillary Refill: Capillary refill takes less than 2 seconds.  Neurological:     General: No focal deficit present.     Mental Status: He is alert. Mental status is at baseline.     Motor: No weakness.     Gait: Gait normal.  Psychiatric:        Mood and Affect: Mood normal.        Behavior: Behavior normal.      UC Treatments / Results  Labs (all labs ordered are listed, but only abnormal results are displayed) Labs Reviewed - No data to display  EKG   Radiology No results found.  Procedures Procedures (including critical care time)  Medications Ordered in UC Medications - No data to display  Initial Impression / Assessment and Plan / UC Course  I have reviewed the triage vital signs and the nursing notes.  Pertinent labs & imaging results that were available during my care of the patient were reviewed by me and considered in my medical decision  making (see chart for details).   22 year old male presents for 3-day history of generalized abdominal cramping, nausea/vomiting and diarrhea.  Denies fever, cough, congestion.  His daughter recently had similar symptoms.  He is afebrile and overall well-appearing.  On exam he has normal HEENT exam.  Chest clear to auscultation and heart regular rate and rhythm.  Abdomen soft and nontender.  Presentation consistent with viral gastroenteritis.  Supportive care encouraged.  Advised him to switch to Imodium.  Sent Zofran to pharmacy.  Encouraged continuing to push fluids and rest.  Work note given for couple days.  Reviewed return and ER precautions.   Final Clinical Impressions(s) / UC Diagnoses   Final diagnoses:  Viral gastroenteritis  Nausea vomiting and diarrhea     Discharge Instructions      ABDOMINAL PAIN: You may take Tylenol for pain relief. Use medications as directed including antiemetics and antidiarrheal medications if suggested or prescribed. You should increase fluids and electrolytes as well as rest over these next several days. If you have any questions or concerns, or if your symptoms are not improving or if especially if they acutely worsen, please call or stop back to the clinic immediately and we will be happy to help you or go to the ER   ABDOMINAL PAIN RED FLAGS: Seek immediate further care if: symptoms remain the same or worsen over the next 3-7 days, you are unable to keep fluids down, you see blood or mucus in your stool, you vomit black or dark red material, you have a fever of 101.F or higher, you have localized and/or persistent abdominal pain       ED Prescriptions     Medication Sig Dispense Auth. Provider   ondansetron (ZOFRAN) 4 MG tablet Take 1 tablet (4 mg total) by mouth every 8 (eight) hours as needed for nausea or vomiting. 12 tablet Gretta Cool      PDMP not reviewed this encounter.   Danton Clap, PA-C 06/27/22 1034

## 2022-10-02 ENCOUNTER — Encounter: Payer: Self-pay | Admitting: Intensive Care

## 2022-10-02 ENCOUNTER — Emergency Department: Payer: 59

## 2022-10-02 ENCOUNTER — Emergency Department
Admission: EM | Admit: 2022-10-02 | Discharge: 2022-10-02 | Disposition: A | Discharge status: Home / Self Care | Payer: 59 | Attending: Emergency Medicine | Admitting: Emergency Medicine

## 2022-10-02 ENCOUNTER — Other Ambulatory Visit: Payer: Self-pay

## 2022-10-02 DIAGNOSIS — A084 Viral intestinal infection, unspecified: Secondary | ICD-10-CM | POA: Diagnosis not present

## 2022-10-02 DIAGNOSIS — D72829 Elevated white blood cell count, unspecified: Secondary | ICD-10-CM | POA: Diagnosis not present

## 2022-10-02 DIAGNOSIS — Z1152 Encounter for screening for COVID-19: Secondary | ICD-10-CM | POA: Insufficient documentation

## 2022-10-02 DIAGNOSIS — R1110 Vomiting, unspecified: Secondary | ICD-10-CM | POA: Diagnosis present

## 2022-10-02 LAB — URINALYSIS, ROUTINE W REFLEX MICROSCOPIC
Bilirubin Urine: NEGATIVE
Glucose, UA: NEGATIVE mg/dL
Hgb urine dipstick: NEGATIVE
Ketones, ur: 20 mg/dL — AB
Leukocytes,Ua: NEGATIVE
Nitrite: NEGATIVE
Protein, ur: NEGATIVE mg/dL
Specific Gravity, Urine: 1.032 — ABNORMAL HIGH (ref 1.005–1.030)
pH: 5 (ref 5.0–8.0)

## 2022-10-02 LAB — CBC WITH DIFFERENTIAL/PLATELET
Abs Immature Granulocytes: 0.22 10*3/uL — ABNORMAL HIGH (ref 0.00–0.07)
Basophils Absolute: 0.1 10*3/uL (ref 0.0–0.1)
Basophils Relative: 1 %
Eosinophils Absolute: 0.1 10*3/uL (ref 0.0–0.5)
Eosinophils Relative: 1 %
HCT: 47.1 % (ref 39.0–52.0)
Hemoglobin: 15.7 g/dL (ref 13.0–17.0)
Immature Granulocytes: 2 %
Lymphocytes Relative: 12 %
Lymphs Abs: 1.4 10*3/uL (ref 0.7–4.0)
MCH: 28.5 pg (ref 26.0–34.0)
MCHC: 33.3 g/dL (ref 30.0–36.0)
MCV: 85.6 fL (ref 80.0–100.0)
Monocytes Absolute: 1.3 10*3/uL — ABNORMAL HIGH (ref 0.1–1.0)
Monocytes Relative: 11 %
Neutro Abs: 8.4 10*3/uL — ABNORMAL HIGH (ref 1.7–7.7)
Neutrophils Relative %: 73 %
Platelets: 283 10*3/uL (ref 150–400)
RBC: 5.5 MIL/uL (ref 4.22–5.81)
RDW: 12.2 % (ref 11.5–15.5)
WBC: 11.4 10*3/uL — ABNORMAL HIGH (ref 4.0–10.5)
nRBC: 0 % (ref 0.0–0.2)

## 2022-10-02 LAB — COMPREHENSIVE METABOLIC PANEL
ALT: 27 U/L (ref 0–44)
AST: 24 U/L (ref 15–41)
Albumin: 4.3 g/dL (ref 3.5–5.0)
Alkaline Phosphatase: 91 U/L (ref 38–126)
Anion gap: 12 (ref 5–15)
BUN: 17 mg/dL (ref 6–20)
CO2: 19 mmol/L — ABNORMAL LOW (ref 22–32)
Calcium: 9.3 mg/dL (ref 8.9–10.3)
Chloride: 103 mmol/L (ref 98–111)
Creatinine, Ser: 0.79 mg/dL (ref 0.61–1.24)
GFR, Estimated: >60 mL/min (ref >60)
Glucose, Bld: 94 mg/dL (ref 70–99)
Potassium: 3.5 mmol/L (ref 3.5–5.1)
Sodium: 134 mmol/L — ABNORMAL LOW (ref 135–145)
Total Bilirubin: 0.9 mg/dL (ref 0.3–1.2)
Total Protein: 7.6 g/dL (ref 6.5–8.1)

## 2022-10-02 LAB — RESP PANEL BY RT-PCR (RSV, FLU A&B, COVID)  RVPGX2
Influenza A by PCR: NEGATIVE
Influenza B by PCR: NEGATIVE
Resp Syncytial Virus by PCR: NEGATIVE
SARS Coronavirus 2 by RT PCR: NEGATIVE

## 2022-10-02 MED ORDER — ONDANSETRON 4 MG PO TBDP
4.0000 mg | ORAL_TABLET | Freq: Once | ORAL | Status: AC
  Administered 2022-10-02: 4 mg via ORAL
  Filled 2022-10-02: qty 1

## 2022-10-02 MED ORDER — ONDANSETRON 4 MG PO TBDP: 4.0000 mg | ORAL_TABLET | Freq: Three times a day (TID) | ORAL | 0 refills | Status: DC | PRN

## 2022-10-02 NOTE — Discharge Instructions (Addendum)
Follow-up with your primary care provider if any continued problems or concerns.  Remain on clear liquids, such as Gatorade, Jell-O, apple juice, popsicles today and tomorrow gradually add back foods starting with the brat diet as tolerated.  This includes bananas, rice, applesauce, and toast.  A prescription for Zofran was sent to the pharmacy for you to take as needed for nausea and vomiting.

## 2022-10-02 NOTE — ED Notes (Signed)
See triage note  Presents with some chest discomfort this am    States he is having pain with breathing  No fever or cough  States he has vomited times 3 since yesterday

## 2022-10-02 NOTE — ED Triage Notes (Signed)
Patient c/o emesis, discomfort in left rib area when taking a breath, lower back pain, and chills

## 2022-10-02 NOTE — ED Provider Notes (Signed)
[facility] Provider Note    Event Date/Time   First MD Initiated Contact with Patient 10/02/22 [phone]     (approximate)   History   Emesis   HPI  Kevin Moody is a 22 y.o. male presents to the ED with complaint of having vomiting 3 times in last 24 hours and also having diarrhea 15 times yesterday.  He reports chills last evening but did not actually take his temperature.  He also complains of some discomfort when taking deep breaths but denies any other URI symptoms.  Patient denies any other family members sick at this time.     Physical Exam   Triage Vital Signs: ED Triage Vitals  Enc Vitals Group     BP 10/02/22 0710 (!) 143/95     Pulse Rate 10/02/22 0710 85     Resp 10/02/22 0710 16     Temp 10/02/22 0710 98.9 F (37.2 C)     Temp Source 10/02/22 0710 Oral     SpO2 10/02/22 0710 96 %     Weight 10/02/22 0711 250 lb (113.4 kg)     Height 10/02/22 0711 6' (1.829 m)     Head Circumference --      Peak Flow --      Pain Score 10/02/22 0711 6     Pain Loc --      Pain Edu? --      Excl. in [place]? --     Most recent vital signs: Vitals:   10/02/22 0710  BP: (!) 143/95  Pulse: 85  Resp: 16  Temp: 98.9 F (37.2 C)  SpO2: 96%     General: Awake, no distress.  Alert, talkative, ambulatory without any assistance. CV:  Good peripheral perfusion.  Heart regular rate and rhythm. Resp:  Normal effort.  Lungs are clear bilaterally. Abd:  No distention.  Soft, nontender, bowel sounds hyperactive x 4 quadrants. Other:     ED Results / Procedures / Treatments   Labs (all labs ordered are listed, but only abnormal results are displayed) Labs Reviewed  CBC WITH DIFFERENTIAL/PLATELET - Abnormal; Notable for the following components:      Result Value   WBC 11.4 (*)    Neutro Abs 8.4 (*)    Monocytes Absolute 1.3 (*)    Abs Immature Granulocytes 0.22 (*)    All other components within normal limits  COMPREHENSIVE METABOLIC PANEL -  Abnormal; Notable for the following components:   Sodium 134 (*)    CO2 19 (*)    All other components within normal limits  URINALYSIS, ROUTINE W REFLEX MICROSCOPIC - Abnormal; Notable for the following components:   Color, Urine YELLOW (*)    APPearance CLEAR (*)    Specific Gravity, Urine 1.032 (*)    Ketones, ur 20 (*)    All other components within normal limits  RESP PANEL BY RT-PCR (RSV, FLU A&B, COVID)  RVPGX2      RADIOLOGY  Chest x-ray images were reviewed by myself independent of the radiologist and no cardiopulmonary abnormalities was noted.  Chest x-ray was reviewed with cardiology report being negative.   PROCEDURES:  Critical Care performed:   Procedures   MEDICATIONS ORDERED IN ED: Medications  ondansetron (ZOFRAN-ODT) disintegrating tablet 4 mg (4 mg Oral Given 10/02/22 0931)     IMPRESSION / MDM / ASSESSMENT AND PLAN / ED COURSE  I reviewed the triage vital signs and the nursing notes.   Differential diagnosis includes, but is  not limited to, gastritis, viral gastroenteritis, dehydration, COVID, influenza, pneumonia, viral illness.  22 year old male presents to the ED with complaint of vomiting, diarrhea and having some chest discomfort with taking deep breaths that started after vomiting.  Patient was reassured with respiratory panel being negative, urinalysis showed 20 ketones, WBC was mildly elevated at 11.4 and CMP was unremarkable.  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  Zofran was given to him while in the ED and no continued vomiting.  Patient was advised to remain on clear liquids the rest of today and gradually add back foods.  Prescription for Zofran was also sent to the pharmacy.  He is to follow-up with his PCP if any continued problems.      Patient's presentation is most consistent with acute complicated illness / injury requiring diagnostic workup.  FINAL  CLINICAL IMPRESSION(S) / ED DIAGNOSES   Final diagnoses:  Viral gastroenteritis     Rx / DC Orders   ED Discharge Orders          Ordered    ondansetron (ZOFRAN-ODT) 4 MG disintegrating tablet  Every 8 hours PRN        10/02/22 0924             Note:  This document was prepared using Dragon voice recognition software and may include unintentional dictation errors.   Tommi Rumps, PA-C 10/02/22 1237    Minna Antis, MD 10/02/22 1427

## 2022-10-03 ENCOUNTER — Telehealth: Payer: Self-pay

## 2022-10-03 NOTE — Transitions of Care (Post Inpatient/ED Visit) (Signed)
   10/03/2022  Name: Kevin Moody MRN: [id number] DOB: March 22, 2001  Today's TOC FU Call Status: Today's TOC FU Call Status:: Unsuccessul Call (1st Attempt) Unsuccessful Call (1st Attempt) Date: 10/03/22  Attempted to reach the patient regarding the most recent Inpatient/ED visit.  Follow Up Plan: Additional outreach attempts will be made to reach the patient to complete the Transitions of Care (Post Inpatient/ED visit) call.   Oneal Grout, [facility]) [facility] [phone]

## 2022-10-04 NOTE — Transitions of Care (Post Inpatient/ED Visit) (Signed)
   10/04/2022  Name: Kevin Moody MRN: [id number] DOB: 16-Oct-2000  Today's TOC FU Call Status: Today's TOC FU Call Status:: Unsuccessful Call (2nd Attempt) Unsuccessful Call (1st Attempt) Date: 10/03/22 Unsuccessful Call (2nd Attempt) Date: 10/04/22  Attempted to reach the patient regarding the most recent Inpatient/ED visit.  Follow Up Plan: Additional outreach attempts will be made to reach the patient to complete the Transitions of Care (Post Inpatient/ED visit) call.   Oneal Grout, [facility]) [facility] [phone]

## 2022-10-05 NOTE — Transitions of Care (Post Inpatient/ED Visit) (Signed)
   10/05/2022  Name: KYNDAL WIGGS MRN: [id number] DOB: 01-16-2001  Today's TOC FU Call Status: Today's TOC FU Call Status:: Unsuccessful Call (3rd Attempt) Unsuccessful Call (1st Attempt) Date: 10/03/22 Unsuccessful Call (2nd Attempt) Date: 10/04/22 Unsuccessful Call (3rd Attempt) Date: 10/05/22  Attempted to reach the patient regarding the most recent Inpatient/ED visit.  Follow Up Plan: No further outreach attempts will be made at this time. We have been unable to contact the patient.  Oneal Grout, [facility]) [facility] [phone]

## 2022-11-28 ENCOUNTER — Ambulatory Visit: Payer: 59 | Admitting: Internal Medicine

## 2023-03-30 ENCOUNTER — Other Ambulatory Visit: Payer: Self-pay

## 2023-03-30 ENCOUNTER — Emergency Department
Admission: EM | Admit: 2023-03-30 | Discharge: 2023-03-30 | Disposition: A | Discharge status: Home / Self Care | Payer: 59 | Attending: Emergency Medicine | Admitting: Emergency Medicine

## 2023-03-30 DIAGNOSIS — Z20822 Contact with and (suspected) exposure to covid-19: Secondary | ICD-10-CM | POA: Insufficient documentation

## 2023-03-30 DIAGNOSIS — K529 Noninfective gastroenteritis and colitis, unspecified: Secondary | ICD-10-CM | POA: Insufficient documentation

## 2023-03-30 DIAGNOSIS — E876 Hypokalemia: Secondary | ICD-10-CM | POA: Diagnosis not present

## 2023-03-30 DIAGNOSIS — R509 Fever, unspecified: Secondary | ICD-10-CM | POA: Diagnosis present

## 2023-03-30 LAB — CBC WITH DIFFERENTIAL/PLATELET
Abs Immature Granulocytes: 0.09 10*3/uL — ABNORMAL HIGH (ref 0.00–0.07)
Basophils Absolute: 0.1 10*3/uL (ref 0.0–0.1)
Basophils Relative: 1 %
Eosinophils Absolute: 0.2 10*3/uL (ref 0.0–0.5)
Eosinophils Relative: 1 %
HCT: 46.6 % (ref 39.0–52.0)
Hemoglobin: 16.1 g/dL (ref 13.0–17.0)
Immature Granulocytes: 1 %
Lymphocytes Relative: 7 %
Lymphs Abs: 1.1 10*3/uL (ref 0.7–4.0)
MCH: 28.6 pg (ref 26.0–34.0)
MCHC: 34.5 g/dL (ref 30.0–36.0)
MCV: 82.8 fL (ref 80.0–100.0)
Monocytes Absolute: 1.4 10*3/uL — ABNORMAL HIGH (ref 0.1–1.0)
Monocytes Relative: 9 %
Neutro Abs: 13 10*3/uL — ABNORMAL HIGH (ref 1.7–7.7)
Neutrophils Relative %: 81 %
Platelets: 259 10*3/uL (ref 150–400)
RBC: 5.63 MIL/uL (ref 4.22–5.81)
RDW: 12.1 % (ref 11.5–15.5)
WBC: 15.8 10*3/uL — ABNORMAL HIGH (ref 4.0–10.5)
nRBC: 0 % (ref 0.0–0.2)

## 2023-03-30 LAB — COMPREHENSIVE METABOLIC PANEL
ALT: 32 U/L (ref 0–44)
AST: 28 U/L (ref 15–41)
Albumin: 4.3 g/dL (ref 3.5–5.0)
Alkaline Phosphatase: 72 U/L (ref 38–126)
Anion gap: 13 (ref 5–15)
BUN: 18 mg/dL (ref 6–20)
CO2: 21 mmol/L — ABNORMAL LOW (ref 22–32)
Calcium: 8.8 mg/dL — ABNORMAL LOW (ref 8.9–10.3)
Chloride: 100 mmol/L (ref 98–111)
Creatinine, Ser: 0.76 mg/dL (ref 0.61–1.24)
GFR, Estimated: >60 mL/min (ref >60)
Glucose, Bld: 95 mg/dL (ref 70–99)
Potassium: 2.7 mmol/L — CL (ref 3.5–5.1)
Sodium: 134 mmol/L — ABNORMAL LOW (ref 135–145)
Total Bilirubin: 1.4 mg/dL — ABNORMAL HIGH (ref <1.2)
Total Protein: 8 g/dL (ref 6.5–8.1)

## 2023-03-30 LAB — LIPASE, BLOOD: Lipase: 65 U/L — ABNORMAL HIGH (ref 11–51)

## 2023-03-30 LAB — RESP PANEL BY RT-PCR (RSV, FLU A&B, COVID)  RVPGX2
Influenza A by PCR: NEGATIVE
Influenza B by PCR: NEGATIVE
Resp Syncytial Virus by PCR: NEGATIVE
SARS Coronavirus 2 by RT PCR: NEGATIVE

## 2023-03-30 LAB — GROUP A STREP BY PCR: Group A Strep by PCR: NOT DETECTED

## 2023-03-30 MED ORDER — FAMOTIDINE 20 MG PO TABS: 20.0000 mg | ORAL_TABLET | Freq: Two times a day (BID) | ORAL | 0 refills | Status: AC

## 2023-03-30 MED ORDER — ACETAMINOPHEN 325 MG PO TABS
650.0000 mg | ORAL_TABLET | Freq: Once | ORAL | Status: AC
  Administered 2023-03-30: 650 mg via ORAL
  Filled 2023-03-30: qty 2

## 2023-03-30 MED ORDER — ONDANSETRON HCL 4 MG/2ML IJ SOLN
4.0000 mg | Freq: Once | INTRAMUSCULAR | Status: AC
  Administered 2023-03-30: 4 mg via INTRAVENOUS
  Filled 2023-03-30: qty 2

## 2023-03-30 MED ORDER — FAMOTIDINE IN NACL 20-0.9 MG/50ML-% IV SOLN
20.0000 mg | Freq: Once | INTRAVENOUS | Status: AC
  Administered 2023-03-30: 20 mg via INTRAVENOUS
  Filled 2023-03-30: qty 50

## 2023-03-30 MED ORDER — ONDANSETRON 4 MG PO TBDP: 4.0000 mg | ORAL_TABLET | Freq: Three times a day (TID) | ORAL | 0 refills | Status: DC | PRN

## 2023-03-30 MED ORDER — POTASSIUM CHLORIDE CRYS ER 20 MEQ PO TBCR: 20.0000 meq | EXTENDED_RELEASE_TABLET | Freq: Two times a day (BID) | ORAL | 0 refills | Status: DC

## 2023-03-30 MED ORDER — POTASSIUM CHLORIDE CRYS ER 20 MEQ PO TBCR
40.0000 meq | EXTENDED_RELEASE_TABLET | Freq: Once | ORAL | Status: AC
  Administered 2023-03-30: 40 meq via ORAL
  Filled 2023-03-30: qty 2

## 2023-03-30 MED ORDER — SODIUM CHLORIDE 0.9 % IV BOLUS
1000.0000 mL | Freq: Once | INTRAVENOUS | Status: AC
  Administered 2023-03-30: 1000 mL via INTRAVENOUS

## 2023-03-30 NOTE — Discharge Instructions (Addendum)
Your symptoms and presentation likely represent a viral gastroenteritis.  You have been treated in the ED with IV fluids, potassium supplementation, and antinausea medication.  She continue to monitor and treat her symptoms at home with the prescription nausea medicine and reflux medicine as prescribed.  Consider a probiotic as well to help with normal gut flora.  Take the potassium supplement for the next week as directed.  Increase your fluid intake to reduce dehydration.  Follow-up with your primary provider or GI medicine for ongoing evaluation.  Return to the ED if needed.

## 2023-03-30 NOTE — ED Provider Notes (Signed)
[facility] Emergency Department Provider Note     Event Date/Time   First MD Initiated Contact with Patient 03/30/23 1809     (approximate)   History   Fever   HPI  Kevin Moody is a 22 y.o. male with a noncontributory medical history, presents to the ED for evaluation of fevers and chills.  Patient with reported Tmax of 101 F on Thursday.  He endorses nausea, vomiting, and diarrhea.  He denies any cough, congestion, chest pain, or abdominal pain.  No sick contacts, recent travel, or bad food exposures are reported.   Physical Exam   Triage Vital Signs: ED Triage Vitals  Encounter Vitals Group     BP 03/30/23 1706 (!) 141/84     Systolic BP Percentile --      Diastolic BP Percentile --      Pulse Rate 03/30/23 1706 (!) 115     Resp 03/30/23 1706 18     Temp 03/30/23 1706 98.9 F (37.2 C)     Temp Source 03/30/23 1706 Oral     SpO2 03/30/23 1706 95 %     Weight --      Height 03/30/23 1704 6' (1.829 m)     Head Circumference --      Peak Flow --      Pain Score 03/30/23 1703 0     Pain Loc --      Pain Education --      Exclude from Growth Chart --     Most recent vital signs: Vitals:   03/30/23 1815 03/30/23 2105  BP: (!) 141/82 117/73  Pulse: 98 86  Resp: 19 18  Temp: (!) 100.6 F (38.1 C) 98.5 F (36.9 C)  SpO2: 95% 97%    General Awake, no distress. NAD HEENT NCAT. PERRL. EOMI. No rhinorrhea. Mucous membranes are moist.  CV:  Good peripheral perfusion. RRR RESP:  Normal effort. CTA ABD:  No distention.  Nontender.  Hyperactive bowel sounds noted.  No rebound, guarding, or rigidity appreciated.   ED Results / Procedures / Treatments   Labs (all labs ordered are listed, but only abnormal results are displayed) Labs Reviewed  COMPREHENSIVE METABOLIC PANEL - Abnormal; Notable for the following components:      Result Value   Sodium 134 (*)    Potassium 2.7 (*)    CO2 21 (*)    Calcium 8.8 (*)    Total Bilirubin  1.4 (*)    All other components within normal limits  CBC WITH DIFFERENTIAL/PLATELET - Abnormal; Notable for the following components:   WBC 15.8 (*)    Neutro Abs 13.0 (*)    Monocytes Absolute 1.4 (*)    Abs Immature Granulocytes 0.09 (*)    All other components within normal limits  LIPASE, BLOOD - Abnormal; Notable for the following components:   Lipase 65 (*)    All other components within normal limits  RESP PANEL BY RT-PCR (RSV, FLU A&B, COVID)  RVPGX2  GROUP A STREP BY PCR   EKG   RADIOLOGY  No results found.   PROCEDURES:  Critical Care performed: No  Procedures   MEDICATIONS ORDERED IN ED: Medications  acetaminophen (TYLENOL) tablet 650 mg (650 mg Oral Given 03/30/23 1902)  sodium chloride 0.9 % bolus 1,000 mL (0 mLs Intravenous Stopped 03/30/23 2102)  ondansetron (ZOFRAN) injection 4 mg (4 mg Intravenous Given 03/30/23 1939)  famotidine (PEPCID) IVPB 20 mg premix (0 mg Intravenous Stopped 03/30/23 2102)  potassium chloride SA (KLOR-CON M) CR tablet 40 mEq (40 mEq Oral Given 03/30/23 2103)     IMPRESSION / MDM / ASSESSMENT AND PLAN / ED COURSE  I reviewed the triage vital signs and the nursing notes.                              Differential diagnosis includes, but is not limited to, COVID, flu, RSV, strep pharyngitis, viral URI, AOM, viral gastroenteritis  Patient's presentation is most consistent with acute complicated illness / injury requiring diagnostic workup.  Patient's diagnosis is consistent with viral gastroenteritis.  Patient with reassuring exam and workup at this time.  No acute lab abnormalities to except a nonacute mild hypokalemia, but no leukocytosis, or critical anemia.  Patient will be discharged home with prescriptions for Zofran, famotidine, and potassium supplementation.  He is also advised to start a probiotic to help with gut flora.  He will start with a liquid diet and advance it as tolerated.  Patient is to follow up with primary provider  as discussed, as needed or otherwise directed. Patient is given ED precautions to return to the ED for any worsening or new symptoms.  FINAL CLINICAL IMPRESSION(S) / ED DIAGNOSES   Final diagnoses:  Gastroenteritis presumed infectious  Hypokalemia     Rx / DC Orders   ED Discharge Orders          Ordered    famotidine (PEPCID) 20 MG tablet  2 times daily        03/30/23 2321    ondansetron (ZOFRAN-ODT) 4 MG disintegrating tablet  Every 8 hours PRN        03/30/23 2321    potassium chloride SA (KLOR-CON M) 20 MEQ tablet  2 times daily        03/30/23 2323             Note:  This document was prepared using Dragon voice recognition software and may include unintentional dictation errors.    Lissa Hoard, PA-C 03/30/23 2324    Dionne Bucy, MD 04/01/23 [phone]

## 2023-03-30 NOTE — ED Triage Notes (Signed)
Pt to ed from home via POV for fever and chills. Pt started running fever Thursday of 101 with chills. Pt is caox4, in no acute distress and ambulatory in triage.

## 2023-05-21 ENCOUNTER — Ambulatory Visit
Admission: EM | Admit: 2023-05-21 | Discharge: 2023-05-21 | Disposition: A | Discharge status: Home / Self Care | Payer: 59 | Attending: Emergency Medicine | Admitting: Emergency Medicine

## 2023-05-21 DIAGNOSIS — B338 Other specified viral diseases: Secondary | ICD-10-CM | POA: Insufficient documentation

## 2023-05-21 DIAGNOSIS — J069 Acute upper respiratory infection, unspecified: Secondary | ICD-10-CM | POA: Insufficient documentation

## 2023-05-21 LAB — RESP PANEL BY RT-PCR (RSV, FLU A&B, COVID)  RVPGX2
Influenza A by PCR: NEGATIVE
Influenza B by PCR: NEGATIVE
Resp Syncytial Virus by PCR: POSITIVE — AB
SARS Coronavirus 2 by RT PCR: NEGATIVE

## 2023-05-21 MED ORDER — PROMETHAZINE-DM 6.25-15 MG/5ML PO SYRP: 5.0000 mL | ORAL_SOLUTION | Freq: Four times a day (QID) | ORAL | 0 refills | Status: DC | PRN

## 2023-05-21 MED ORDER — FLUTICASONE PROPIONATE 50 MCG/ACT NA SUSP: 2.0000 | Freq: Every day | NASAL | 0 refills | Status: DC

## 2023-05-21 MED ORDER — ONDANSETRON 8 MG PO TBDP: ORAL_TABLET | ORAL | 0 refills | Status: DC

## 2023-05-21 MED ORDER — NAPROXEN 500 MG PO TABS: 500.0000 mg | ORAL_TABLET | Freq: Two times a day (BID) | ORAL | 0 refills | Status: DC

## 2023-05-21 NOTE — ED Provider Notes (Signed)
HPI  SUBJECTIVE:  Kevin Moody is a 23 y.o. male who presents with 3 days of nausea, 1 episode of emesis this morning, feeling feverish with bodyaches, headaches, nasal congestion, yellow/green/orange rhinorrhea, frontal sinus pain and pressure, sore throat, postnasal drip, cough productive of green/orange mucus.  No facial swelling, upper dental pain, chest pain, wheezing or shortness of breath, abdominal pain, diarrhea.  Patient is tolerating p.o.  He was exposed to RSV.  Both of his daughters, 1 of whom is here today, have been diagnosed with RSV.  No known COVID, flu, strep exposure.  He has tried Aleve, NyQuil, and pushing electrolyte containing fluids without improvement in his symptoms.  Symptoms worse with coughing.  No antibiotics in the past month.  No antipyretic in the past 6 hours.  He has no past medical history.  PCP: None.    Past Medical History:  Diagnosis Date   ADHD    Allergy    Anxiety    Depression    GERD (gastroesophageal reflux disease)     Past Surgical History:  Procedure Laterality Date   TYMPANOSTOMY TUBE PLACEMENT Bilateral 2004   repeat 2007    Family History  Problem Relation Age of Onset   Depression Mother    Anxiety disorder Mother    Bipolar disorder Mother    Depression Father    Anxiety disorder Father    Depression Maternal Grandmother    Anxiety disorder Maternal Grandmother    Alcohol abuse Maternal Grandfather    Alcohol abuse Maternal Uncle     Social History   Tobacco Use   Smoking status: Never   Smokeless tobacco: Current    Types: Chew    Last attempt to quit: 09/22/2018  Vaping Use   Vaping status: Former  Substance Use Topics   Alcohol use: Yes    Comment: rare   Drug use: Not Currently    Types: Marijuana, "Crack" cocaine    Comment: 5-6 times a day, history of last use of cocaine 04/2021    No current facility-administered medications for this encounter.  Current Outpatient Medications:    fluticasone (FLONASE)  50 MCG/ACT nasal spray, Place 2 sprays into both nostrils daily., Disp: 16 g, Rfl: 0   naproxen (NAPROSYN) 500 MG tablet, Take 1 tablet (500 mg total) by mouth 2 (two) times daily., Disp: 20 tablet, Rfl: 0   ondansetron (ZOFRAN-ODT) 8 MG disintegrating tablet, 1/2- 1 tablet q 8 hr prn nausea, vomiting, Disp: 20 tablet, Rfl: 0   promethazine-dextromethorphan (PROMETHAZINE-DM) 6.25-15 MG/5ML syrup, Take 5 mLs by mouth 4 (four) times daily as needed for cough., Disp: 118 mL, Rfl: 0   famotidine (PEPCID) 20 MG tablet, Take 1 tablet (20 mg total) by mouth 2 (two) times daily., Disp: 60 tablet, Rfl: 0  Allergies  Allergen Reactions   Seasonal Ic [Cholestatin]      ROS  As noted in HPI.   Physical Exam  BP 134/86 (BP Location: Left Arm)   Pulse 93   Temp 99.5 F (37.5 C) (Oral)   Resp 16   Ht 6' (1.829 m)   Wt 120.2 kg   SpO2 100%   BMI 35.94 kg/m   Constitutional: Well developed, well nourished, no acute distress Eyes:  EOMI, conjunctiva normal bilaterally HENT: Normocephalic, atraumatic,mucus membranes moist.  Erythematous, swollen turbinates.  No maxillary, frontal sinus tenderness.  Slightly erythematous oropharynx.  Tonsils normal size without exudates.  Uvula midline. Neck: Positive cervical lymphadenopathy Respiratory: Normal inspiratory effort, lungs clear bilaterally,  good air movement Cardiovascular: Normal rate, regular rhythm, no murmurs rubs or gallops.  Cap refill less than 2 seconds. GI: nondistended skin: No rash, skin intact Musculoskeletal: no deformities Neurologic: Alert & oriented x 3, no focal neuro deficits Psychiatric: Speech and behavior appropriate   ED Course   Medications - No data to display  Orders Placed This Encounter  Procedures   Resp panel by RT-PCR (RSV, Flu A&B, Covid) Anterior Nasal Swab    Standing Status:   Standing    Number of Occurrences:   1   Airborne and Contact precautions    Standing Status:   Standing    Number of  Occurrences:   1   No results found for this or any previous visit (from the past 24 hours). No results found. Results for orders placed or performed during the hospital encounter of 05/21/23  Resp panel by RT-PCR (RSV, Flu A&B, Covid) Anterior Nasal Swab   Collection Time: 05/21/23  9:53 AM   Specimen: Anterior Nasal Swab  Result Value Ref Range   SARS Coronavirus 2 by RT PCR NEGATIVE NEGATIVE   Influenza A by PCR NEGATIVE NEGATIVE   Influenza B by PCR NEGATIVE NEGATIVE   Resp Syncytial Virus by PCR POSITIVE (A) NEGATIVE     ED Clinical Impression  1. RSV infection   2. Viral URI with cough      ED Assessment/Plan     Fathers primary concern is RSV infection.  Will check COVID, flu, RSV.    RSV positive.  COVID, flu negative.  Home with Aleve/Tylenol twice daily, Zofran, Mucinex D, Flonase, saline nasal irrigation Promethazine DM.  May return here or follow-up with his PCP for double sickening or if ill for 10 days and we can consider antibiotics for sinusitis.  States he will reestablish care with his previous PCP.   Discussed labs, MDM, treatment plan, and plan for follow-up with patient.  patient agrees with plan.   Meds ordered this encounter  Medications   fluticasone (FLONASE) 50 MCG/ACT nasal spray    Sig: Place 2 sprays into both nostrils daily.    Dispense:  16 g    Refill:  0   promethazine-dextromethorphan (PROMETHAZINE-DM) 6.25-15 MG/5ML syrup    Sig: Take 5 mLs by mouth 4 (four) times daily as needed for cough.    Dispense:  118 mL    Refill:  0   naproxen (NAPROSYN) 500 MG tablet    Sig: Take 1 tablet (500 mg total) by mouth 2 (two) times daily.    Dispense:  20 tablet    Refill:  0   ondansetron (ZOFRAN-ODT) 8 MG disintegrating tablet    Sig: 1/2- 1 tablet q 8 hr prn nausea, vomiting    Dispense:  20 tablet    Refill:  0      *This clinic note was created using Scientist, clinical (histocompatibility and immunogenetics). Therefore, there may be occasional mistakes despite  careful proofreading.  ?    Domenick Gong, MD 05/22/23 1753

## 2023-05-21 NOTE — ED Triage Notes (Signed)
Pt c/o cough,congestion,fever & emesis x3 days. Had 1 episode of emesis this AM.

## 2023-05-21 NOTE — Discharge Instructions (Signed)
Your RSV is positive.  Zofran as needed for nausea vomiting.  Continue pushing electrolyte containing fluids.  Mucinex D for nasal congestion.  Flonase, saline nasal irrigation with a NeilMed sinus rinse and distilled water as often as you want to reduce nasal congestion and prevent a bacterial sinus infection.  Take the Aleve with 1000 mg of Tylenol twice a day for headaches, body aches, fevers.  Promethazine DM as needed for cough.

## 2023-05-27 ENCOUNTER — Other Ambulatory Visit: Payer: Self-pay

## 2023-05-27 ENCOUNTER — Emergency Department
Admission: EM | Admit: 2023-05-27 | Discharge: 2023-05-27 | Disposition: A | Discharge status: Home / Self Care | Payer: 59 | Attending: Emergency Medicine | Admitting: Emergency Medicine

## 2023-05-27 ENCOUNTER — Emergency Department: Payer: 59

## 2023-05-27 DIAGNOSIS — R059 Cough, unspecified: Secondary | ICD-10-CM | POA: Diagnosis present

## 2023-05-27 DIAGNOSIS — B974 Respiratory syncytial virus as the cause of diseases classified elsewhere: Secondary | ICD-10-CM | POA: Diagnosis not present

## 2023-05-27 DIAGNOSIS — B338 Other specified viral diseases: Secondary | ICD-10-CM

## 2023-05-27 LAB — CBC WITH DIFFERENTIAL/PLATELET
Abs Immature Granulocytes: 0.22 10*3/uL — ABNORMAL HIGH (ref 0.00–0.07)
Basophils Absolute: 0.1 10*3/uL (ref 0.0–0.1)
Basophils Relative: 1 %
Eosinophils Absolute: 0.6 10*3/uL — ABNORMAL HIGH (ref 0.0–0.5)
Eosinophils Relative: 3 %
HCT: 44.7 % (ref 39.0–52.0)
Hemoglobin: 15.2 g/dL (ref 13.0–17.0)
Immature Granulocytes: 1 %
Lymphocytes Relative: 20 %
Lymphs Abs: 3.6 10*3/uL (ref 0.7–4.0)
MCH: 28.6 pg (ref 26.0–34.0)
MCHC: 34 g/dL (ref 30.0–36.0)
MCV: 84.2 fL (ref 80.0–100.0)
Monocytes Absolute: 1.1 10*3/uL — ABNORMAL HIGH (ref 0.1–1.0)
Monocytes Relative: 6 %
Neutro Abs: 12.6 10*3/uL — ABNORMAL HIGH (ref 1.7–7.7)
Neutrophils Relative %: 69 %
Platelets: 391 10*3/uL (ref 150–400)
RBC: 5.31 MIL/uL (ref 4.22–5.81)
RDW: 12 % (ref 11.5–15.5)
WBC: 18.2 10*3/uL — ABNORMAL HIGH (ref 4.0–10.5)
nRBC: 0 % (ref 0.0–0.2)

## 2023-05-27 LAB — BASIC METABOLIC PANEL
Anion gap: 12 (ref 5–15)
BUN: 13 mg/dL (ref 6–20)
CO2: 26 mmol/L (ref 22–32)
Calcium: 9.8 mg/dL (ref 8.9–10.3)
Chloride: 101 mmol/L (ref 98–111)
Creatinine, Ser: 0.75 mg/dL (ref 0.61–1.24)
GFR, Estimated: >60 mL/min (ref >60)
Glucose, Bld: 101 mg/dL — ABNORMAL HIGH (ref 70–99)
Potassium: 4.2 mmol/L (ref 3.5–5.1)
Sodium: 139 mmol/L (ref 135–145)

## 2023-05-27 MED ORDER — BENZONATATE 100 MG PO CAPS: 100.0000 mg | ORAL_CAPSULE | Freq: Three times a day (TID) | ORAL | 0 refills | Status: DC | PRN

## 2023-05-27 MED ORDER — PREDNISONE 10 MG PO TABS: ORAL_TABLET | ORAL | 0 refills | Status: DC

## 2023-05-27 MED ORDER — DEXAMETHASONE SODIUM PHOSPHATE 10 MG/ML IJ SOLN
10.0000 mg | Freq: Once | INTRAMUSCULAR | Status: AC
  Administered 2023-05-27: 10 mg via INTRAMUSCULAR
  Filled 2023-05-27: qty 1

## 2023-05-27 NOTE — ED Triage Notes (Signed)
Diagnosed with RSV last Monday. FEels symptoms are worse.  Wakes up in the middle of the night feeling like he can't breath. Continues to have fever, vomiting, stomach pain.  Has not taken anything for fever in last few days.  AAOx3.  Skin warm and dry. NAD

## 2023-05-27 NOTE — ED Provider Notes (Signed)
[facility] Provider Note    Event Date/Time   First MD Initiated Contact with Patient 05/27/23 0818     (approximate)   History   No chief complaint on file.   HPI  Kevin Moody is a 23 y.o. male presents to the ED with complaint of shortness of breath difficulty breathing with waking up during the night coughing.  Patient was seen on 1/28 8/25 at which time he was diagnosed with RSV.  He states no medication was prescribed but he has been taking DayQuil with minimal relief.  Patient denies smoking but does report history of childhood asthma.  Patient was prescribed Flonase, Promethazine DM, Zofran and naproxen which he did not get filled due to the price of medication.     Physical Exam   Triage Vital Signs: ED Triage Vitals [05/27/23 0812]  Encounter Vitals Group     BP (!) 156/91     Systolic BP Percentile      Diastolic BP Percentile      Pulse Rate 76     Resp 16     Temp 98.4 F (36.9 C)     Temp Source Oral     SpO2      Weight 264 lb 8.8 oz (120 kg)     Height 6' (1.829 m)     Head Circumference      Peak Flow      Pain Score 0     Pain Loc      Pain Education      Exclude from Growth Chart     Most recent vital signs: Vitals:   05/27/23 0812 05/27/23 0853  BP: (!) 156/91 (!) 152/88  Pulse: 76 74  Resp: 16 16  Temp: 98.4 F (36.9 C) 98.4 F (36.9 C)  SpO2:  96%     General: Awake, no distress.  Able to talk in complete sentences without any difficulty breathing.  Coarse cough noted. CV:  Good peripheral perfusion.  Heart regular rate and rhythm. Resp:  Normal effort.  Lungs without wheezes noted bilaterally. Abd:  No distention.  Other:     ED Results / Procedures / Treatments   Labs (all labs ordered are listed, but only abnormal results are displayed) Labs Reviewed  BASIC METABOLIC PANEL - Abnormal; Notable for the following components:      Result Value   Glucose, Bld 101 (*)    All other components within  normal limits  CBC WITH DIFFERENTIAL/PLATELET - Abnormal; Notable for the following components:   WBC 18.2 (*)    Neutro Abs 12.6 (*)    Monocytes Absolute 1.1 (*)    Eosinophils Absolute 0.6 (*)    Abs Immature Granulocytes 0.22 (*)    All other components within normal limits    RADIOLOGY Chest x-ray images reviewed and interpreted by myself independent the radiologist and no infiltrate was noted.    PROCEDURES:  Critical Care performed:   Procedures   MEDICATIONS ORDERED IN ED: Medications  dexamethasone (DECADRON) injection 10 mg (10 mg Intramuscular Given 05/27/23 0933)     IMPRESSION / MDM / ASSESSMENT AND PLAN / ED COURSE  I reviewed the triage vital signs and the nursing notes.   Differential diagnosis includes, but is not limited to, RSV previously diagnosed and confirmed, pneumonia, bronchitis.  23 year old male presents to the ED with continued symptoms after being diagnosed with RSV last week.  Patient was unable to get his medication due to expenses.  Patient reassured that he does not have pneumonia.  Patient was given Decadron 10 mg IM while in the ED.  A prescription for prednisone for the next 5 days and Tessalon Perles was sent to the pharmacy.  Patient is encouraged to drink fluids and follow-up with his PCP if any continued problems.  He is aware that he may have symptoms for 2 to 3 weeks.     Patient's presentation is most consistent with acute illness / injury with system symptoms.  FINAL CLINICAL IMPRESSION(S) / ED DIAGNOSES   Final diagnoses:  RSV (respiratory syncytial virus infection)     Rx / DC Orders   ED Discharge Orders          Ordered    benzonatate (TESSALON PERLES) 100 MG capsule  3 times daily PRN        05/27/23 0928    predniSONE (DELTASONE) 10 MG tablet        05/27/23 1610             Note:  This document was prepared using Dragon voice recognition software and may include unintentional dictation errors.    Tommi Rumps, PA-C 05/27/23 9604    Minna Antis, MD 05/27/23 1106

## 2023-05-27 NOTE — Discharge Instructions (Signed)
Follow-up with your primary care provider if any continued problems or concerns.  A prescription for prednisone and Tessalon Perles was sent to the pharmacy for you to take starting today.  The Tessalon is every 8 hours if needed for cough.  This medication will not cause drowsiness.  The prednisone is 3 tablets once a day for the next 5 days.  You can continue taking your over-the-counter medication if needed for congestion, Tylenol or ibuprofen as needed for fever or bodyaches.

## 2024-04-01 ENCOUNTER — Ambulatory Visit
Admission: EM | Admit: 2024-04-01 | Discharge: 2024-04-01 | Disposition: A | Discharge status: Home / Self Care | Attending: Physician Assistant | Admitting: Physician Assistant

## 2024-04-01 DIAGNOSIS — R051 Acute cough: Secondary | ICD-10-CM

## 2024-04-01 DIAGNOSIS — J029 Acute pharyngitis, unspecified: Secondary | ICD-10-CM

## 2024-04-01 DIAGNOSIS — J0300 Acute streptococcal tonsillitis, unspecified: Secondary | ICD-10-CM

## 2024-04-01 DIAGNOSIS — R509 Fever, unspecified: Secondary | ICD-10-CM

## 2024-04-01 LAB — POCT RAPID STREP A (OFFICE): Rapid Strep A Screen: POSITIVE — AB

## 2024-04-01 LAB — POCT INFLUENZA A/B
Influenza A, POC: NEGATIVE
Influenza B, POC: NEGATIVE

## 2024-04-01 LAB — POC SOFIA SARS ANTIGEN FIA: SARS Coronavirus 2 Ag: NEGATIVE

## 2024-04-01 MED ORDER — AMOXICILLIN-POT CLAVULANATE 875-125 MG PO TABS: 1.0000 | ORAL_TABLET | Freq: Two times a day (BID) | ORAL | 0 refills | Status: DC

## 2024-04-01 MED ORDER — AMOXICILLIN 500 MG PO CAPS: 500.0000 mg | ORAL_CAPSULE | Freq: Two times a day (BID) | ORAL | 0 refills | Status: AC

## 2024-04-01 MED ORDER — LIDOCAINE VISCOUS HCL 2 % MT SOLN: 15.0000 mL | OROMUCOSAL | 0 refills | Status: AC | PRN

## 2024-04-01 MED ORDER — PREDNISONE 20 MG PO TABS: 40.0000 mg | ORAL_TABLET | Freq: Every day | ORAL | 0 refills | Status: AC

## 2024-04-01 NOTE — ED Triage Notes (Signed)
 Pt c/o sore throat & fever x3 days. States worse at night. No OTC meds.

## 2024-04-01 NOTE — ED Provider Notes (Signed)
 [facility]    CSN: [id number] Arrival date & time: 04/01/24  0902      History   Chief Complaint Chief Complaint  Patient presents with   Sore Throat   Fever    HPI Kevin Moody is a 23 y.o. male presenting for fever up to 101 degrees, fatigue, sore throat, difficulty swallowing, dry cough, and congestion x 3 days.  Denies ear pain, body aches, sinus pain, chest pain, wheezing, shortness of breath, abdominal pain, vomiting or diarrhea.  Patient has been taking over-the-counter meds. No other complaints.   HPI  Past Medical History:  Diagnosis Date   ADHD    Allergy    Anxiety    Depression    GERD (gastroesophageal reflux disease)     Patient Active Problem List   Diagnosis Date Noted   Obesity (BMI 35.0-39.9 without comorbidity) 11/02/2019   Mixed hyperlipidemia 11/02/2019   Attention deficit hyperactivity disorder (ADHD), combined type 09/29/2019   Gastroesophageal reflux disease 09/29/2019   Major depressive disorder, recurrent, in partial remission 09/29/2019   GAD (generalized anxiety disorder) 09/29/2019    Past Surgical History:  Procedure Laterality Date   TYMPANOSTOMY TUBE PLACEMENT Bilateral 2004   repeat 2007       Home Medications    Prior to Admission medications   Medication Sig Start Date End Date Taking? Authorizing Provider  amoxicillin (AMOXIL) 500 MG capsule Take 1 capsule (500 mg total) by mouth 2 (two) times daily for 10 days. 04/01/24 04/11/24 Yes Arvis Huxley B, PA-C  lidocaine (XYLOCAINE) 2 % solution Use as directed 15 mLs in the mouth or throat every 3 (three) hours as needed for mouth pain (swish and spit). 04/01/24  Yes Arvis Huxley NOVAK, PA-C  predniSONE  (DELTASONE ) 20 MG tablet Take 2 tablets (40 mg total) by mouth daily for 3 days. 04/01/24 04/04/24 Yes Arvis Huxley NOVAK, PA-C  famotidine  (PEPCID ) 20 MG tablet Take 1 tablet (20 mg total) by mouth 2 (two) times daily. 03/30/23 04/29/23  Menshew, Candida LULLA Kings, PA-C     Family History Family History  Problem Relation Age of Onset   Depression Mother    Anxiety disorder Mother    Bipolar disorder Mother    Depression Father    Anxiety disorder Father    Depression Maternal Grandmother    Anxiety disorder Maternal Grandmother    Alcohol abuse Maternal Grandfather    Alcohol abuse Maternal Uncle     Social History Social History   Tobacco Use   Smoking status: Never   Smokeless tobacco: Current    Types: Chew    Last attempt to quit: 09/22/2018  Vaping Use   Vaping status: Former  Substance Use Topics   Alcohol use: Yes    Comment: rare   Drug use: Not Currently    Types: Marijuana, Crack cocaine    Comment: 5-6 times a day, history of last use of cocaine 04/2021     Allergies   Seasonal ic [cholestatin]   Review of Systems Review of Systems  Constitutional:  Positive for fatigue and fever.  HENT:  Positive for congestion, rhinorrhea and sore throat. Negative for sinus pain.   Respiratory:  Positive for cough. Negative for shortness of breath.   Cardiovascular:  Negative for chest pain.  Gastrointestinal:  Negative for abdominal pain, diarrhea, nausea and vomiting.  Musculoskeletal:  Negative for myalgias.  Skin:  Positive for rash.  Neurological:  Negative for weakness, light-headedness and headaches.  Hematological:  Positive for  adenopathy.     Physical Exam Triage Vital Signs ED Triage Vitals  Encounter Vitals Group     BP      Girls Systolic BP Percentile      Girls Diastolic BP Percentile      Boys Systolic BP Percentile      Boys Diastolic BP Percentile      Pulse      Resp      Temp      Temp src      SpO2      Weight      Height      Head Circumference      Peak Flow      Pain Score      Pain Loc      Pain Education      Exclude from Growth Chart    No data found.  Updated Vital Signs BP (!) 140/97 (BP Location: Right Arm)   Pulse 78   Temp 98.7 F (37.1 C) (Oral)   Resp 18   Wt 246 lb  (111.6 kg)   SpO2 98%   BMI 33.36 kg/m     Physical Exam Vitals and nursing note reviewed.  Constitutional:      General: He is not in acute distress.    Appearance: Normal appearance. He is well-developed. He is ill-appearing.  HENT:     Head: Normocephalic and atraumatic.     Nose: Nose normal.     Mouth/Throat:     Mouth: Mucous membranes are moist.     Pharynx: Oropharynx is clear. Posterior oropharyngeal erythema present.     Tonsils: 3+ on the right. 3+ on the left.  Eyes:     General: No scleral icterus.    Conjunctiva/sclera: Conjunctivae normal.  Cardiovascular:     Rate and Rhythm: Normal rate and regular rhythm.  Pulmonary:     Effort: Pulmonary effort is normal. No respiratory distress.     Breath sounds: Normal breath sounds.  Abdominal:     Palpations: Abdomen is soft.  Musculoskeletal:     Cervical back: Neck supple.  Lymphadenopathy:     Cervical: Cervical adenopathy present.  Skin:    General: Skin is warm and dry.     Capillary Refill: Capillary refill takes less than 2 seconds.  Neurological:     General: No focal deficit present.     Mental Status: He is alert. Mental status is at baseline.     Motor: No weakness.     Gait: Gait normal.  Psychiatric:        Mood and Affect: Mood normal.        Behavior: Behavior normal.      UC Treatments / Results  Labs (all labs ordered are listed, but only abnormal results are displayed) Labs Reviewed  POCT RAPID STREP A (OFFICE) - Abnormal; Notable for the following components:      Result Value   Rapid Strep A Screen Positive (*)    All other components within normal limits  POC SOFIA SARS ANTIGEN FIA - Normal  POCT INFLUENZA A/B - Normal    EKG   Radiology No results found.  Procedures Procedures (including critical care time)  Medications Ordered in UC Medications - No data to display  Initial Impression / Assessment and Plan / UC Course  I have reviewed the triage vital signs and the  nursing notes.  Pertinent labs & imaging results that were available during my care of the patient were  reviewed by me and considered in my medical decision making (see chart for details).   23 y/o male presents for 3 day history of fatigue, sore throat, tonsil swelling, cough and congestion. Temps up to 101 degrees.  Patient is currently afebrile.  Ill-appearing but nontoxic.  On exam has 3+ enlarged tonsils with erythema.  Nose is clear.  Chest clear.  Heart regular rate and rhythm.  Rapid strep, flu and COVID testing obtained. +strep. Negative COVID and flu testing.   Strep tonsillitis.  Will treat at this time with prednisone  40 mg x 3 days to help with tonsillar swelling, viscous lidocaine for pain relief and amoxicillin 500 mg twice daily x 10 days.  Discussed the importance of taking all medication as prescribed.  Reviewed return and ER precautions.  Acute illness with systemic symptoms.   Final Clinical Impressions(s) / UC Diagnoses   Final diagnoses:  Sore throat  Strep tonsillitis  Fever, unspecified  Acute cough     Discharge Instructions      - Positive strep test. - Start antibiotics and take full course. - I also sent prednisone  to help with tonsil swelling. - If uncontrolled fever, weakness or inability to swallow please go to the ER.     ED Prescriptions     Medication Sig Dispense Auth. Provider   predniSONE  (DELTASONE ) 20 MG tablet Take 2 tablets (40 mg total) by mouth daily for 3 days. 6 tablet Arvis Huxley B, PA-C   amoxicillin-clavulanate (AUGMENTIN) 875-125 MG tablet  (Status: Discontinued) Take 1 tablet by mouth every 12 (twelve) hours for 7 days. 14 tablet Arvis Huxley B, PA-C   lidocaine (XYLOCAINE) 2 % solution Use as directed 15 mLs in the mouth or throat every 3 (three) hours as needed for mouth pain (swish and spit). 100 mL Arvis Huxley B, PA-C   amoxicillin (AMOXIL) 500 MG capsule Take 1 capsule (500 mg total) by mouth 2 (two) times daily for  10 days. 20 capsule Arvis Huxley NOVAK, PA-C      PDMP not reviewed this encounter.   Arvis Huxley NOVAK, PA-C 04/01/24 [phone]

## 2024-04-01 NOTE — Discharge Instructions (Addendum)
-   Positive strep test. - Start antibiotics and take full course. - I also sent prednisone  to help with tonsil swelling. - If uncontrolled fever, weakness or inability to swallow please go to the ER.
